# Patient Record
Sex: Male | Born: 1980 | Race: White | Marital: Married | Smoking: Never smoker
Health system: Southern US, Community
[De-identification: ages and names within clinical notes are randomized; demographics above are authoritative.]

## PROBLEM LIST (undated history)

## (undated) DIAGNOSIS — I10 Essential (primary) hypertension: Secondary | ICD-10-CM

## (undated) DIAGNOSIS — E119 Type 2 diabetes mellitus without complications: Secondary | ICD-10-CM

## (undated) HISTORY — PX: FRACTURE SURGERY: SHX138

---

## 2019-10-18 ENCOUNTER — Encounter: Payer: Self-pay | Admitting: Emergency Medicine

## 2019-10-18 ENCOUNTER — Emergency Department: Payer: Self-pay

## 2019-10-18 ENCOUNTER — Emergency Department
Admission: EM | Admit: 2019-10-18 | Discharge: 2019-10-18 | Disposition: A | Discharge status: Home / Self Care | Payer: Self-pay | Attending: Emergency Medicine | Admitting: Emergency Medicine

## 2019-10-18 ENCOUNTER — Other Ambulatory Visit: Payer: Self-pay

## 2019-10-18 DIAGNOSIS — E119 Type 2 diabetes mellitus without complications: Secondary | ICD-10-CM | POA: Insufficient documentation

## 2019-10-18 DIAGNOSIS — Z20822 Contact with and (suspected) exposure to covid-19: Secondary | ICD-10-CM | POA: Insufficient documentation

## 2019-10-18 DIAGNOSIS — I10 Essential (primary) hypertension: Secondary | ICD-10-CM | POA: Insufficient documentation

## 2019-10-18 DIAGNOSIS — R1110 Vomiting, unspecified: Secondary | ICD-10-CM | POA: Insufficient documentation

## 2019-10-18 HISTORY — DX: Type 2 diabetes mellitus without complications: E11.9

## 2019-10-18 HISTORY — DX: Essential (primary) hypertension: I10

## 2019-10-18 LAB — COMPREHENSIVE METABOLIC PANEL
ALT: 14 U/L (ref 0–44)
AST: 9 U/L — ABNORMAL LOW (ref 15–41)
Albumin: 4.4 g/dL (ref 3.5–5.0)
Alkaline Phosphatase: 72 U/L (ref 38–126)
Anion gap: 15 (ref 5–15)
BUN: 16 mg/dL (ref 6–20)
CO2: 22 mmol/L (ref 22–32)
Calcium: 9.3 mg/dL (ref 8.9–10.3)
Chloride: 93 mmol/L — ABNORMAL LOW (ref 98–111)
Creatinine, Ser: 0.89 mg/dL (ref 0.61–1.24)
GFR calc Af Amer: >60 mL/min (ref >60)
GFR calc non Af Amer: >60 mL/min (ref >60)
Glucose, Bld: 231 mg/dL — ABNORMAL HIGH (ref 70–99)
Potassium: 4.2 mmol/L (ref 3.5–5.1)
Sodium: 130 mmol/L — ABNORMAL LOW (ref 135–145)
Total Bilirubin: 1.4 mg/dL — ABNORMAL HIGH (ref 0.3–1.2)
Total Protein: 7.9 g/dL (ref 6.5–8.1)

## 2019-10-18 LAB — CBC
HCT: 47.8 % (ref 39.0–52.0)
Hemoglobin: 16 g/dL (ref 13.0–17.0)
MCH: 28.1 pg (ref 26.0–34.0)
MCHC: 33.5 g/dL (ref 30.0–36.0)
MCV: 83.9 fL (ref 80.0–100.0)
Platelets: 298 10*3/uL (ref 150–400)
RBC: 5.7 MIL/uL (ref 4.22–5.81)
RDW: 12.4 % (ref 11.5–15.5)
WBC: 9.3 10*3/uL (ref 4.0–10.5)
nRBC: 0 % (ref 0.0–0.2)

## 2019-10-18 LAB — URINALYSIS, COMPLETE (UACMP) WITH MICROSCOPIC
Bacteria, UA: NONE SEEN
Bilirubin Urine: NEGATIVE
Glucose, UA: 50 mg/dL — AB
Hgb urine dipstick: NEGATIVE
Ketones, ur: 80 mg/dL — AB
Leukocytes,Ua: NEGATIVE
Nitrite: NEGATIVE
Protein, ur: 30 mg/dL — AB
Specific Gravity, Urine: 1.029 (ref 1.005–1.030)
pH: 5 (ref 5.0–8.0)

## 2019-10-18 LAB — LIPASE, BLOOD: Lipase: 32 U/L (ref 11–51)

## 2019-10-18 LAB — POC SARS CORONAVIRUS 2 AG: SARS Coronavirus 2 Ag: NEGATIVE

## 2019-10-18 LAB — GLUCOSE, CAPILLARY: Glucose-Capillary: 241 mg/dL — ABNORMAL HIGH (ref 70–99)

## 2019-10-18 MED ORDER — INSULIN ASPART 100 UNIT/ML ~~LOC~~ SOLN
4.0000 [IU] | Freq: Once | SUBCUTANEOUS | Status: AC
  Administered 2019-10-18: 4 [IU] via INTRAVENOUS
  Filled 2019-10-18: qty 1

## 2019-10-18 MED ORDER — SODIUM CHLORIDE 0.9% FLUSH: 3.0000 mL | Freq: Once | INTRAVENOUS | Status: DC

## 2019-10-18 MED ORDER — PROMETHAZINE HCL 25 MG/ML IJ SOLN
25.0000 mg | Freq: Once | INTRAMUSCULAR | Status: AC
  Administered 2019-10-18: 25 mg via INTRAVENOUS
  Filled 2019-10-18: qty 1

## 2019-10-18 MED ORDER — SODIUM CHLORIDE 0.9 % IV BOLUS
1000.0000 mL | Freq: Once | INTRAVENOUS | Status: AC
  Administered 2019-10-18: 1000 mL via INTRAVENOUS

## 2019-10-18 MED ORDER — METOCLOPRAMIDE HCL 5 MG/ML IJ SOLN
10.0000 mg | Freq: Once | INTRAMUSCULAR | Status: AC
  Administered 2019-10-18: 10 mg via INTRAVENOUS
  Filled 2019-10-18: qty 2

## 2019-10-18 MED ORDER — PROMETHAZINE HCL 25 MG PO TABS: 25.0000 mg | ORAL_TABLET | Freq: Four times a day (QID) | ORAL | 0 refills | Status: DC | PRN

## 2019-10-18 MED ORDER — ONDANSETRON HCL 4 MG/2ML IJ SOLN
4.0000 mg | Freq: Once | INTRAMUSCULAR | Status: AC
  Administered 2019-10-18: 4 mg via INTRAVENOUS
  Filled 2019-10-18: qty 2

## 2019-10-18 MED ORDER — IOHEXOL 300 MG/ML  SOLN
100.0000 mL | Freq: Once | INTRAMUSCULAR | Status: AC | PRN
  Administered 2019-10-18: 100 mL via INTRAVENOUS
  Filled 2019-10-18: qty 100

## 2019-10-18 NOTE — ED Provider Notes (Signed)
[facility] Emergency Department Provider Note  ____________________________________________   First MD Initiated Contact with Patient 10/18/19 1134     (approximate)  I have reviewed the triage vital signs and the nursing notes.   HISTORY  Chief Complaint Emesis    HPI Christopher Livingston. is a 39 y.o. male presents emergency department thinking that he has food poisoning.  However he states that his right lower quadrant does hurt.  Is had more than 12 episodes of vomiting in the past 24 hours.  He is a insulin-dependent diabetic.  He is concerned that he is getting worse.  He states he is started to run a fever and has chills.  No diarrhea.  He denies chest pain, shortness of breath.  He denies Covid exposure    Past Medical History:  Diagnosis Date  . Diabetes mellitus without complication (HCC)   . Hypertension     There are no problems to display for this patient.   Past Surgical History:  Procedure Laterality Date  . FRACTURE SURGERY      Prior to Admission medications   Medication Sig Start Date End Date Taking? Authorizing Provider  promethazine (PHENERGAN) 25 MG tablet Take 1 tablet (25 mg total) by mouth every 6 (six) hours as needed for nausea or vomiting. 10/18/19   Faythe Ghee, PA-C    Allergies Levaquin [levofloxacin]  No family history on file.  Social History Social History   Tobacco Use  . Smoking status: Never Smoker  . Smokeless tobacco: Never Used  Substance Use Topics  . Alcohol use: Not Currently  . Drug use: Not Currently    Review of Systems  Constitutional: No fever/chills Eyes: No visual changes. ENT: No sore throat. Respiratory: Denies cough Cardiovascular: Denies chest pain Gastrointestinal: Positive for right lower quadrant abdominal pain, positive for vomiting Genitourinary: Negative for dysuria. Musculoskeletal: Negative for back pain. Skin: Negative for rash. Psychiatric: no mood changes,      ____________________________________________   PHYSICAL EXAM:  VITAL SIGNS: ED Triage Vitals  Enc Vitals Group     BP 10/18/19 0827 (!) 155/80     Pulse Rate 10/18/19 0827 81     Resp 10/18/19 0827 16     Temp 10/18/19 0827 98.4 F (36.9 C)     Temp Source 10/18/19 0827 Oral     SpO2 10/18/19 0827 100 %     Weight 10/18/19 0828 161 lb (73 kg)     Height 10/18/19 0828 5\' 7"  (1.702 m)     Head Circumference --      Peak Flow --      Pain Score 10/18/19 0828 0     Pain Loc --      Pain Edu? --      Excl. in GC? --     Constitutional: Alert and oriented. Well appearing and in no acute distress. Eyes: Conjunctivae are normal.  Head: Atraumatic. Nose: No congestion/rhinnorhea. Mouth/Throat: Mucous membranes are moist.   Neck:  supple no lymphadenopathy noted Cardiovascular: Normal rate, regular rhythm. Heart sounds are normal Respiratory: Normal respiratory effort.  No retractions, lungs c t a  Abd: soft tender in the right lower quadrant, bs normal all 4 quad GU: deferred Musculoskeletal: FROM all extremities, warm and well perfused Neurologic:  Normal speech and language.  Skin:  Skin is warm, dry and intact. No rash noted. Psychiatric: Mood and affect are normal. Speech and behavior are normal.  ____________________________________________   LABS (all labs ordered are  listed, but only abnormal results are displayed)  Labs Reviewed  COMPREHENSIVE METABOLIC PANEL - Abnormal; Notable for the following components:      Result Value   Sodium 130 (*)    Chloride 93 (*)    Glucose, Bld 231 (*)    AST 9 (*)    Total Bilirubin 1.4 (*)    All other components within normal limits  URINALYSIS, COMPLETE (UACMP) WITH MICROSCOPIC - Abnormal; Notable for the following components:   Color, Urine YELLOW (*)    APPearance CLEAR (*)    Glucose, UA 50 (*)    Ketones, ur 80 (*)    Protein, ur 30 (*)    All other components within normal limits  GLUCOSE, CAPILLARY -  Abnormal; Notable for the following components:   Glucose-Capillary 241 (*)    All other components within normal limits  LIPASE, BLOOD  CBC  CBG MONITORING, ED  POC SARS CORONAVIRUS 2 AG -  ED  POC SARS CORONAVIRUS 2 AG   ____________________________________________   ____________________________________________  RADIOLOGY  CT abdomen/pelvis with IV contrast is negative  ____________________________________________   PROCEDURES  Procedure(s) performed: Saline lock, normal saline 2 L IV, Zofran, Phenergan, and Reglan given   Procedures    ____________________________________________   INITIAL IMPRESSION / ASSESSMENT AND PLAN / ED COURSE  Pertinent labs & imaging results that were available during my care of the patient were reviewed by me and considered in my medical decision making (see chart for details).   Patient is 39 year old insulin-dependent diabetic presents emergency department complaining of right lower quadrant pain along with 12 episodes of vomiting last 24 hours.  See HPI  Physical exam patient's vitals are normal.  he appears to be very stable.  Abdomen is soft but tender in the right lower quadrant.  DDx: Acute appendicitis, food poisoning, bowel obstruction, viral illness  CBC is normal, comprehensive metabolic panel has glucose of 231, sodium of 130, bilirubin is slightly increased at 1.4, lipase is normal, urinalysis shows 80 ketones  Explained the findings to the patient.  He will get fluids.  Due to the right lower quadrant pain I will do a CT abdomen/pelvis to rule out appendicitis.   CT abdomen/pelvis is negative.  Patient continues to have some nausea does not feel that he can hold solid foods down.  He was given Phenergan IV along with a second bolus of normal saline.  After the Phenergan and a second bolus he still feels that he cannot retain solid foods.  He was given Reglan which she states he kept after cracker down.  He has been able to  keep liquids down.  And I offered to put him in the hospital due to being unable to hold solid foods down, he refuses.  States that he will have insurance and cannot afford to stay.  I feel give me Phenergan I will return if I am getting worse. We did discuss strict instructions for return.  Have cautioned him that he could end up in DKA.  Patient still wants to be discharged.  Christopher Livingston. was evaluated in Emergency Department on 10/18/2019 for the symptoms described in the history of present illness. He was evaluated in the context of the global COVID-19 pandemic, which necessitated consideration that the patient might be at risk for infection with the SARS-CoV-2 virus that causes COVID-19. Institutional protocols and algorithms that pertain to the evaluation of patients at risk for COVID-19 are in a state of rapid change based on  information released by regulatory bodies including the CDC and federal and state organizations. These policies and algorithms were followed during the patient's care in the ED.   As part of my medical decision making, I reviewed the following data within the electronic MEDICAL RECORD NUMBER Nursing notes reviewed and incorporated, Labs reviewed see above, Old chart reviewed, Radiograph reviewed see above, Notes from prior ED visits and [place] Controlled Substance Database  ____________________________________________   FINAL CLINICAL IMPRESSION(S) / ED DIAGNOSES  Final diagnoses:  Vomiting in adult      NEW MEDICATIONS STARTED DURING THIS VISIT:  New Prescriptions   PROMETHAZINE (PHENERGAN) 25 MG TABLET    Take 1 tablet (25 mg total) by mouth every 6 (six) hours as needed for nausea or vomiting.     Note:  This document was prepared using Dragon voice recognition software and may include unintentional dictation errors.    Faythe Ghee, PA-C 10/18/19 1509    Phineas Semen, MD 10/18/19 [phone]

## 2019-10-18 NOTE — ED Notes (Signed)
Blood Glucose 241  

## 2019-10-18 NOTE — ED Notes (Signed)
Pt reports eating some "bad chicken" 2 days ago and not being able to keep any food down. Pt states he has had 12 episodes of emesis in the past 24 hours. Pt denies any diarrhea. Pt is type 1 diabetic.

## 2019-10-18 NOTE — Discharge Instructions (Addendum)
Return to the emergency department if you are unable to hold solid foods down.  Take medication as prescribed.

## 2019-10-18 NOTE — ED Triage Notes (Addendum)
Pt to ED via POV c/o vomiting since Friday. Pt states that he is not able to keep anything down. Last vomited this morning. Denies pain at this time, state that he did have pain when this first started on Friday. Pt denies known fever. PT has hx/o diabetes. Pt is in NAD at this time.

## 2020-08-04 ENCOUNTER — Ambulatory Visit (INDEPENDENT_AMBULATORY_CARE_PROVIDER_SITE_OTHER): Payer: No Typology Code available for payment source

## 2020-08-04 ENCOUNTER — Other Ambulatory Visit: Payer: Self-pay

## 2020-08-04 ENCOUNTER — Ambulatory Visit
Admission: RE | Admit: 2020-08-04 | Discharge: 2020-08-04 | Disposition: A | Discharge status: Home / Self Care | Payer: No Typology Code available for payment source | Source: Ambulatory Visit | Attending: Family Medicine | Admitting: Family Medicine

## 2020-08-04 VITALS — BP 135/81 | HR 105 | Temp 98.4°F | Resp 18 | Ht 67.0 in | Wt 173.0 lb

## 2020-08-04 DIAGNOSIS — R059 Cough, unspecified: Secondary | ICD-10-CM | POA: Diagnosis not present

## 2020-08-04 DIAGNOSIS — R0982 Postnasal drip: Secondary | ICD-10-CM

## 2020-08-04 DIAGNOSIS — J209 Acute bronchitis, unspecified: Secondary | ICD-10-CM | POA: Diagnosis not present

## 2020-08-04 MED ORDER — AZITHROMYCIN 250 MG PO TABS: ORAL_TABLET | ORAL | 0 refills | Status: DC

## 2020-08-04 MED ORDER — IPRATROPIUM BROMIDE 0.06 % NA SOLN: 2.0000 | Freq: Four times a day (QID) | NASAL | 0 refills | Status: DC | PRN

## 2020-08-04 MED ORDER — BENZONATATE 200 MG PO CAPS: 200.0000 mg | ORAL_CAPSULE | Freq: Three times a day (TID) | ORAL | 0 refills | Status: DC | PRN

## 2020-08-04 NOTE — ED Provider Notes (Signed)
[facility]    CSN: [id number] Arrival date & time: 08/04/20  0846  History   Chief Complaint Chief Complaint  Patient presents with  . Appointment  . Cough  . post nasal drip  . sneezing   HPI  39 year old male presents with cough, rhinorrhea, postnasal drip.  3-week history of symptoms.  He is most troubled by the cough.  He was sent by his employer as he was coughing quite a bit at work.  No fever.  He has had sick contacts.  He states that he has had a recent cold.  Has been using Sudafed, Zyrtec, and guaifenesin without resolution.  He has been vaccine against COVID-19.  No fever.  No other reported symptoms.  No other complaints.   Past Medical History:  Diagnosis Date  . Diabetes mellitus without complication (HCC)   . Hypertension    Past Surgical History:  Procedure Laterality Date  . FRACTURE SURGERY     Home Medications    Prior to Admission medications   Medication Sig Start Date End Date Taking? Authorizing Provider  insulin aspart (NOVOLOG) 100 UNIT/ML injection Inject into the skin 3 (three) times daily before meals. Sliding scale   Yes [provider]  insulin glargine (LANTUS) 100 UNIT/ML injection Inject 40 Units into the skin at bedtime.   Yes [provider]  azithromycin (ZITHROMAX) 250 MG tablet 2 tablets on day 1, then 1 tablet daily on days 2-5. 08/04/20   Tommie Sams, DO  benzonatate (TESSALON) 200 MG capsule Take 1 capsule (200 mg total) by mouth 3 (three) times daily as needed for cough. 08/04/20   Everlene Other G, DO  ipratropium (ATROVENT) 0.06 % nasal spray Place 2 sprays into both nostrils 4 (four) times daily as needed for rhinitis. 08/04/20   Tommie Sams, DO  promethazine (PHENERGAN) 25 MG tablet Take 1 tablet (25 mg total) by mouth every 6 (six) hours as needed for nausea or vomiting. 10/18/19 08/04/20  Faythe Ghee, PA-C    Family History Family History  Problem Relation Age of Onset  . Cancer Mother   .  Diabetes Mother   . Diabetes Father     Social History Social History   Tobacco Use  . Smoking status: Never Smoker  . Smokeless tobacco: Never Used  Vaping Use  . Vaping Use: Never used  Substance Use Topics  . Alcohol use: Not Currently  . Drug use: Not Currently     Allergies   Levaquin [levofloxacin]   Review of Systems Review of Systems  Constitutional: Negative for fever.  HENT: Positive for postnasal drip and rhinorrhea.   Respiratory: Positive for cough.    Physical Exam Triage Vital Signs ED Triage Vitals  Enc Vitals Group     BP 08/04/20 0911 135/81     Pulse Rate 08/04/20 0911 (!) 105     Resp 08/04/20 0911 18     Temp 08/04/20 0911 98.4 F (36.9 C)     Temp Source 08/04/20 0911 Oral     SpO2 08/04/20 0911 100 %     Weight 08/04/20 0912 173 lb (78.5 kg)     Height 08/04/20 0912 5\' 7"  (1.702 m)     Head Circumference --      Peak Flow --      Pain Score 08/04/20 0911 0     Pain Loc --      Pain Edu? --      Excl. in GC? --  Updated Vital Signs BP 135/81 (BP Location: Left Arm)   Pulse (!) 105   Temp 98.4 F (36.9 C) (Oral)   Resp 18   Ht 5\' 7"  (1.702 m)   Wt 78.5 kg   SpO2 100%   BMI 27.10 kg/m   Visual Acuity Right Eye Distance:   Left Eye Distance:   Bilateral Distance:    Right Eye Near:   Left Eye Near:    Bilateral Near:     Physical Exam Vitals and nursing note reviewed.  Constitutional:      General: He is not in acute distress.    Appearance: Normal appearance. He is not ill-appearing.  HENT:     Head: Normocephalic and atraumatic.     Nose: Rhinorrhea present.  Eyes:     General:        Right eye: No discharge.        Left eye: No discharge.     Conjunctiva/sclera: Conjunctivae normal.  Cardiovascular:     Rate and Rhythm: Regular rhythm. Tachycardia present.  Pulmonary:     Effort: Pulmonary effort is normal.     Breath sounds: Normal breath sounds.  Neurological:     Mental Status: He is alert.   Psychiatric:        Mood and Affect: Mood normal.        Behavior: Behavior normal.    UC Treatments / Results  Labs (all labs ordered are listed, but only abnormal results are displayed) Labs Reviewed - No data to display  EKG   Radiology DG Chest 2 View  Result Date: 08/04/2020 CLINICAL DATA:  Cough EXAM: CHEST - 2 VIEW COMPARISON:  None. FINDINGS: The heart size and mediastinal contours are within normal limits. Both lungs are clear. No pleural effusion. The visualized skeletal structures are unremarkable. IMPRESSION: No acute process in the chest. Electronically Signed   By: 13/12/2019 M.D.   On: 08/04/2020 09:27    Procedures Procedures (including critical care time)  Medications Ordered in UC Medications - No data to display  Initial Impression / Assessment and Plan / UC Course  I have reviewed the triage vital signs and the nursing notes.  Pertinent labs & imaging results that were available during my care of the patient were reviewed by me and considered in my medical decision making (see chart for details).    39 year old male presents with acute bronchitis.  Also having postnasal drip.  Given duration of symptoms, treating with Tessalon Perles, azithromycin. Atrovent nasal spray as directed.  Final Clinical Impressions(s) / UC Diagnoses   Final diagnoses:  Acute bronchitis, unspecified organism  Post-nasal drip     Discharge Instructions     Medications as prescribed.  Lots of fluids.  Okay to return to work.  Take care  Dr. 30    ED Prescriptions    Medication Sig Dispense Auth. Provider   benzonatate (TESSALON) 200 MG capsule Take 1 capsule (200 mg total) by mouth 3 (three) times daily as needed for cough. 30 capsule Sherrye Puga G, DO   azithromycin (ZITHROMAX) 250 MG tablet 2 tablets on day 1, then 1 tablet daily on days 2-5. 6 tablet Mialynn Shelvin G, DO   ipratropium (ATROVENT) 0.06 % nasal spray Place 2 sprays into both nostrils 4 (four)  times daily as needed for rhinitis. 15 mL 12-[birth year]-1983, DO     PDMP not reviewed this encounter.   Tommie Sams, DO 08/04/20 1019

## 2020-08-04 NOTE — Discharge Instructions (Signed)
Medications as prescribed.  Lots of fluids.  Okay to return to work.  Take care  Dr. Adriana Simas

## 2020-08-04 NOTE — ED Triage Notes (Signed)
Patient in today c/o 3 week history of cough, sneezing and post nasal drip. Patient denies any fevers. Patient has taken OTC Sudafed, Zyrtec and Guaifenesin without relief. Patient has completed the covid vaccines.

## 2021-03-17 DIAGNOSIS — G8929 Other chronic pain: Secondary | ICD-10-CM | POA: Insufficient documentation

## 2021-03-17 DIAGNOSIS — N529 Male erectile dysfunction, unspecified: Secondary | ICD-10-CM | POA: Insufficient documentation

## 2021-03-17 DIAGNOSIS — L409 Psoriasis, unspecified: Secondary | ICD-10-CM | POA: Insufficient documentation

## 2021-03-17 DIAGNOSIS — M25572 Pain in left ankle and joints of left foot: Secondary | ICD-10-CM | POA: Insufficient documentation

## 2021-03-25 DIAGNOSIS — E1169 Type 2 diabetes mellitus with other specified complication: Secondary | ICD-10-CM | POA: Insufficient documentation

## 2021-03-25 DIAGNOSIS — E782 Mixed hyperlipidemia: Secondary | ICD-10-CM | POA: Insufficient documentation

## 2021-03-25 DIAGNOSIS — E119 Type 2 diabetes mellitus without complications: Secondary | ICD-10-CM | POA: Insufficient documentation

## 2021-03-29 ENCOUNTER — Encounter: Payer: Self-pay | Admitting: Urology

## 2021-03-29 ENCOUNTER — Ambulatory Visit (INDEPENDENT_AMBULATORY_CARE_PROVIDER_SITE_OTHER): Payer: No Typology Code available for payment source | Admitting: Urology

## 2021-03-29 ENCOUNTER — Other Ambulatory Visit: Payer: Self-pay

## 2021-03-29 VITALS — BP 145/83 | HR 94 | Ht 67.0 in | Wt 184.0 lb

## 2021-03-29 DIAGNOSIS — Z3009 Encounter for other general counseling and advice on contraception: Secondary | ICD-10-CM | POA: Diagnosis not present

## 2021-03-29 DIAGNOSIS — N5201 Erectile dysfunction due to arterial insufficiency: Secondary | ICD-10-CM | POA: Diagnosis not present

## 2021-03-29 MED ORDER — TADALAFIL 5 MG PO TABS: 5.0000 mg | ORAL_TABLET | ORAL | 6 refills | Status: DC | PRN

## 2021-03-29 MED ORDER — DIAZEPAM 5 MG PO TABS: 5.0000 mg | ORAL_TABLET | Freq: Once | ORAL | 0 refills | Status: DC | PRN

## 2021-03-29 NOTE — Patient Instructions (Signed)
Pre-Vasectomy Instructions  STOP all aspirin or blood thinners (Aspirin, Plavix, Coumadin, Warfarin, Motrin, Ibuprofen, Advil, Aleve, Naproxen, Naprosyn) for 7 days prior to the procedure.  If you have any questions about stopping these medications please contact your primary care physician or cardiologist.  Shave all hair from the upper scrotum on the day of the procedure.  This means just under the penis onto the scrotal sac.  The area shaved should measure about 2-3 inches around.  You may lather the scrotum with soap and water, and shave with a safety razor.  After shaving the area, thoroughly wash the penis and the scrotum, then shower or bathe to remove all the loose hairs.  If needed, wash the area again just before coming in for your circumcision.  It is recommended to have a light meal an hour or so prior to the procedure.  Bring a scrotal support (jock strap or suspensory, or tight jockey shorts or underwear).  Wear comfortable pants or shorts.  While the actual procedure usually takes about 45 minutes, you should be prepared to stay in the office for approximately one hour.  Bring someone with you to drive you home.  If you have any questions or concerns, please feel free to call the office at [phone].  Vasectomy Vasectomy is a procedure in which the vas deferens is cut and then tied or burned (cauterized). The vas deferens is a tube that carries sperm from the testicle to the part of the body that drains urine from the bladder (urethra). This procedure blocks sperm from going through the vas deferens and penis during ejaculation. This ensures that sperm does not go into the vagina during sex. Vasectomy does not affect sexual desire or performance and does notprevent sexually transmitted infections. Vasectomy is considered a permanent and very effective form of birth control (contraception). The decision to have a vasectomy should not be made during a stressful time, such as after  the loss of a pregnancy or a divorce. You and your partner should decide on whether to have a vasectomy when you are sure that you do not wantchildren in the future. Tell a health care provider about: Any allergies you have. All medicines you are taking, including vitamins, herbs, eye drops, creams, and over-the-counter medicines. Any problems you or family members have had with anesthetic medicines. Any blood disorders you have. Any surgeries you have had. Any medical conditions you have. What are the risks? Generally, this is a safe procedure. However, problems may occur, including: Infection. Bleeding and swelling of the scrotum. The scrotum is the sac that contains the testicles, blood vessels, and structures that help deliver sperm and semen. Allergic reactions to medicines. Failure of the procedure to prevent pregnancy. There is a very small chance that the tied or cauterized ends of the vas deferens may reconnect (recanalization). If this happens, you could still make a woman pregnant. Pain in the scrotum that continues after you heal from the procedure. What happens before the procedure? Medicines Ask your health care provider about: Changing or stopping your regular medicines. This is especially important if you are taking diabetes medicines or blood thinners. Taking medicines such as aspirin and ibuprofen. These medicines can thin your blood. Do not take these medicines unless your health care provider tells you to take them. Taking over-the-counter medicines, vitamins, herbs, and supplements. You may be told to take a medicine to help you relax (sedative) a few hours before the procedure. General instructions Do not use any   products that contain nicotine or tobacco for at least 4 weeks before the procedure. These products include cigarettes, e-cigarettes, and chewing tobacco. If you need help quitting, ask your health care provider. Plan to have a responsible adult take you home  from the hospital or clinic. If you will be going home right after the procedure, plan to have a responsible adult care for you for the time you are told. This is important. Ask your health care provider: How your surgery site will be marked. What steps will be taken to help prevent infection. These steps may include: Removing hair at the surgery site. Washing skin with a germ-killing soap. Taking antibiotic medicine. What happens during the procedure?  You will be given one or more of the following: A sedative, unless you were told to take this a few hours before the procedure. A medicine to numb the area (local anesthetic). Your health care provider will feel, or palpate, for your vas deferens. To reach the vas deferens, one of two methods may be used: A very small incision may be made in your scrotum. A punctured opening may be made in your scrotum, without an incision. Your vas deferens will be pulled out of your scrotum and cut. Then, the vas deferens will be closed in one of two ways: Tied at the ends. Cauterized at the ends to seal them off. The vas deferens will be put back into your scrotum. The incision or puncture opening will be closed with absorbable stitches (sutures). The sutures will eventually dissolve and will not need to be removed after the procedure. The procedure will be repeated on the other side of your scrotum. The procedure may vary among health care providers and hospitals. What happens after the procedure? You will be monitored to make sure that you do not have problems. You will be asked not to ejaculate for at least 1 week after the procedure, or for as long as you are told. You will need to use a different form of contraception for 2-4 months after the procedure, until you have test results confirming that there are no sperm in your semen. You may be given scrotal support to wear, such as a jockstrap or underwear with a supportive pouch. If you were given a  sedative during the procedure, it can affect you for several hours. Do not drive or operate machinery until your health care provider says that it is safe. Summary Vasectomy blocks sperm from being released during ejaculation. This procedure is considered a permanent and very effective form of birth control. Your scrotum will be numbed with medicine (local anesthetic) for the procedure. After the procedure, you will be asked not to ejaculate for at least 1 week, or for as long as you are told. You will also need to use a different form of contraception until your test results confirm that there are no sperm in your semen. This information is not intended to replace advice given to you by your health care provider. Make sure you discuss any questions you have with your healthcare provider. Document Revised: 02/05/2020 Document Reviewed: 02/05/2020 Elsevier Patient Education  2022 [employer]  

## 2021-03-29 NOTE — Progress Notes (Signed)
   03/29/21 9:02 AM   Christopher Livingston. 1981-07-18 [id number]  CC: Discuss vasectomy, ED  HPI: 40 year old male with diabetes(hemoglobin A1c 8.7) who presents to discuss vasectomy, as well as erectile dysfunction.  He has had some trouble with erections over the last few months which he thinks may be related to stress.  He was tried on sildenafil 20 mg as needed by PCP with no significant improvement in his erections.  They have 2 children ages 76 and 59 and are not interested in further pregnancies.   PMH: Past Medical History:  Diagnosis Date   Diabetes mellitus without complication (HCC)    Hypertension     Surgical History: Past Surgical History:  Procedure Laterality Date   FRACTURE SURGERY      Family History: Family History  Problem Relation Age of Onset   Cancer Mother    Diabetes Mother    Diabetes Father     Social History:  reports that he has never smoked. He has never used smokeless tobacco. He reports previous alcohol use. He reports previous drug use.  Physical Exam: BP (!) 145/83 (BP Location: Left Arm, Patient Position: Sitting, Cuff Size: Normal)   Pulse 94   Ht 5\' 7"  (1.702 m)   Wt 184 lb (83.5 kg)   BMI 28.82 kg/m    Constitutional:  Alert and oriented, No acute distress. Cardiovascular: No clubbing, cyanosis, or edema. Respiratory: Normal respiratory effort, no increased work of breathing. GI: Abdomen is soft, nontender, nondistended, no abdominal masses GU: Phallus with patent meatus, no lesions, testicles 15 cc and descended bilaterally without masses, vas deferens challenging but palpable bilaterally  Assessment & Plan:   40 year old male with diabetes interested in vasectomy for permanent sterilization, as well as with erectile dysfunction.  We discussed the impact of poorly controlled diabetes as well as stress on erections, and the difference between the PDE 5 inhibitors.  We discussed the risks and benefits of vasectomy at length.   Vasectomy is intended to be a permanent form of contraception, and does not produce immediate sterility.  Following vasectomy another form of contraception is required until vas occlusion is confirmed by a post-vasectomy semen analysis obtained 2-3 months after the procedure.  Even after vas occlusion is confirmed, vasectomy is not 100% reliable in preventing pregnancy, and the failure rate is approximately 10/1998.  Repeat vasectomy is required in less than 1% of patients.  He should refrain from ejaculation for 1 week after vasectomy.  Options for fertility after vasectomy include vasectomy reversal, and sperm retrieval with in vitro fertilization or ICSI.  These options are not always successful and may be expensive.  Finally, there are other permanent and non-permanent alternatives to vasectomy available. There is no risk of erectile dysfunction, and the volume of semen will be similar to prior, as the majority of the ejaculate is from the prostate and seminal vesicles.   The procedure takes ~20 minutes.  We recommend patients take 5-10 mg of Valium 30 minutes prior, and he will need a driver post-procedure.  Local anesthetic is injected into the scrotal skin and a small segment of the vas deferens is removed, and the ends occluded. The complication rate is approximately 1-2%, and includes bleeding, infection, and development of chronic scrotal pain.  PLAN: Schedule vasectomy Valium sent to pharmacy Trial of Cialis 5 mg as needed for ED   11/1998, MD 03/29/2021  [facility] [address] [place], [place] [phone]

## 2021-04-28 ENCOUNTER — Encounter: Payer: No Typology Code available for payment source | Admitting: Urology

## 2021-07-13 IMAGING — CT CT ABD-PELV W/ CM
2 of 4 series · 16 of 46 positions shown, 18 images · IV contrast (omnipaque)
Comparison: None.

CLINICAL DATA: Right lower quadrant pain, nausea, vomiting

EXAM:
CT ABDOMEN AND PELVIS WITH CONTRAST
TECHNIQUE: Multidetector CT imaging of the abdomen and pelvis was performed
using the standard protocol following bolus administration of
intravenous contrast.
CONTRAST:  100mL OMNIPAQUE IOHEXOL 300 MG/ML  SOLN

[Series 2: routine abd/pel with · axial · 0.77mm/px · z∈[-1009,-579]mm · 13 of 96 slices shown, 15 images]
[im 5/96  soft-tissue]
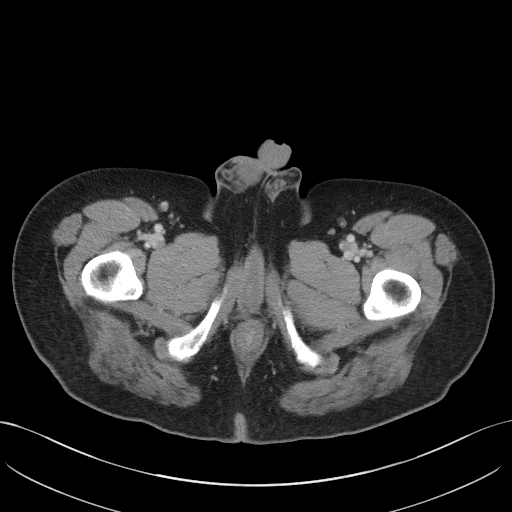
[im 5/96  bone]
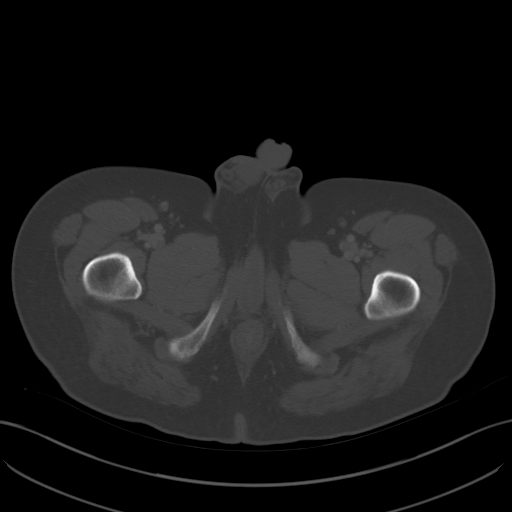
[im 13/96  soft-tissue]
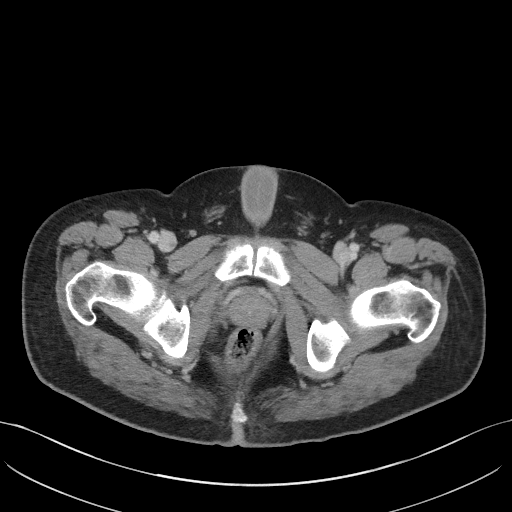
[im 21/96  soft-tissue]
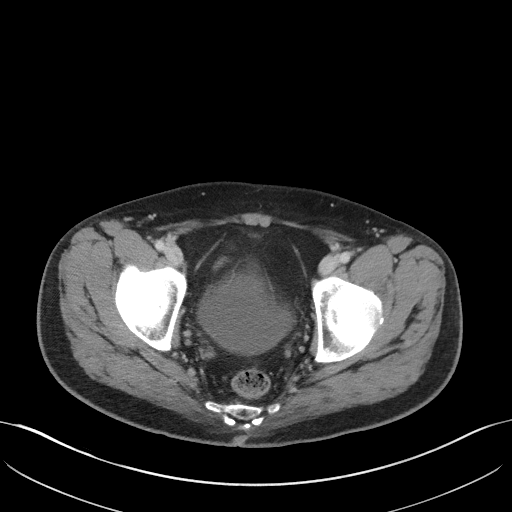
[im 25/96  soft-tissue]
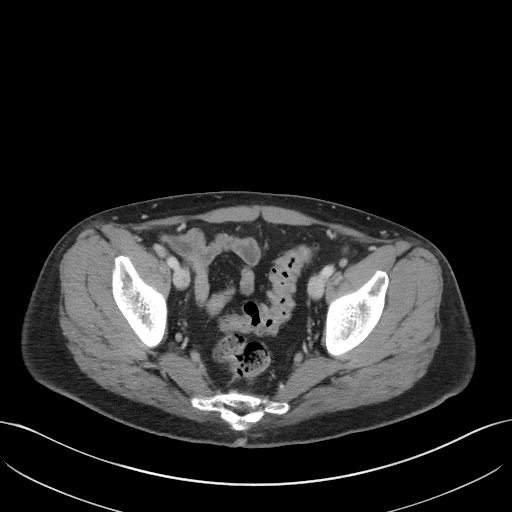
[im 34/96  soft-tissue]
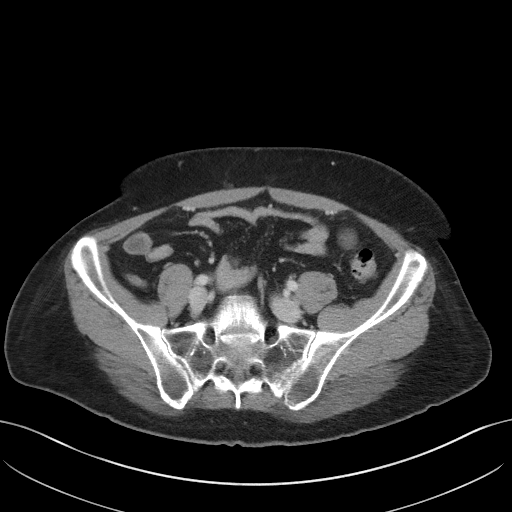
[im 42/96  soft-tissue]
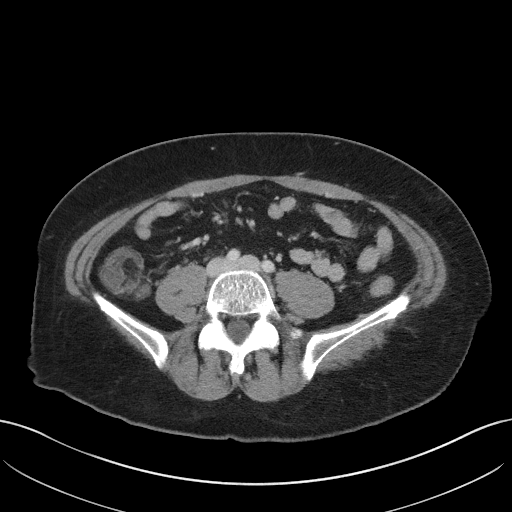
[im 50/96  soft-tissue]
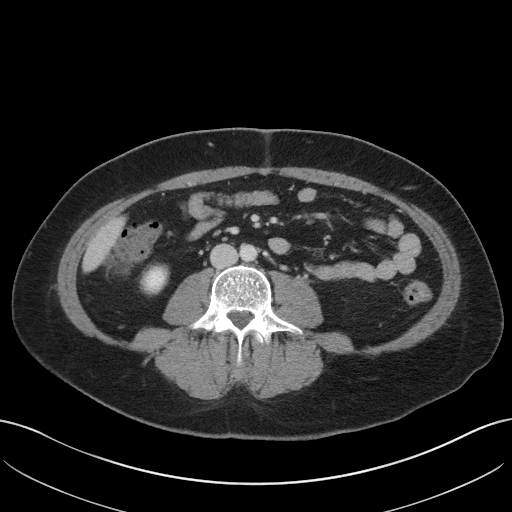
[im 54/96  soft-tissue]
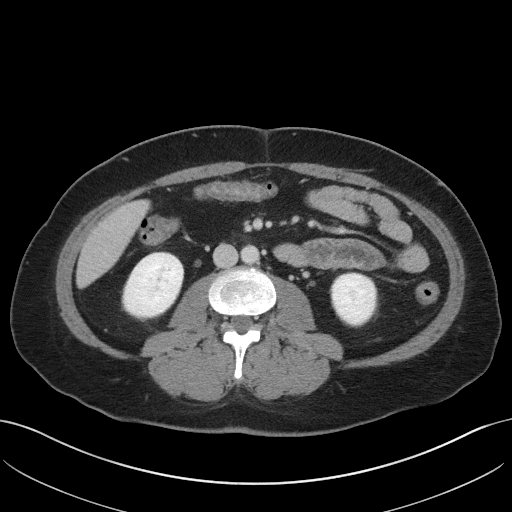
[im 62/96  soft-tissue]
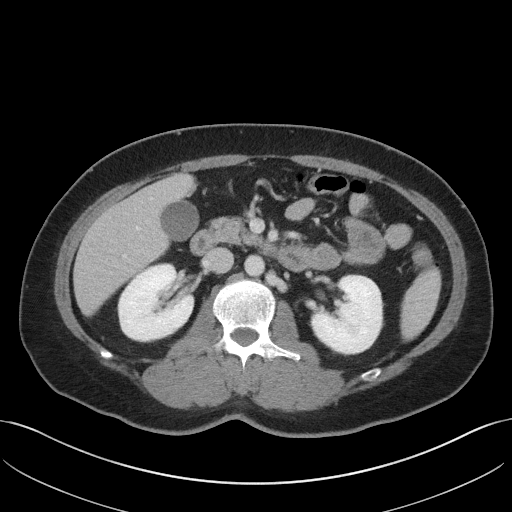
[im 62/96  bone]
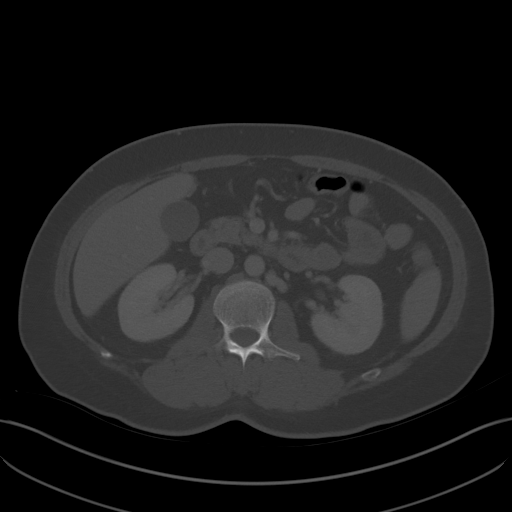
[im 71/96  soft-tissue]
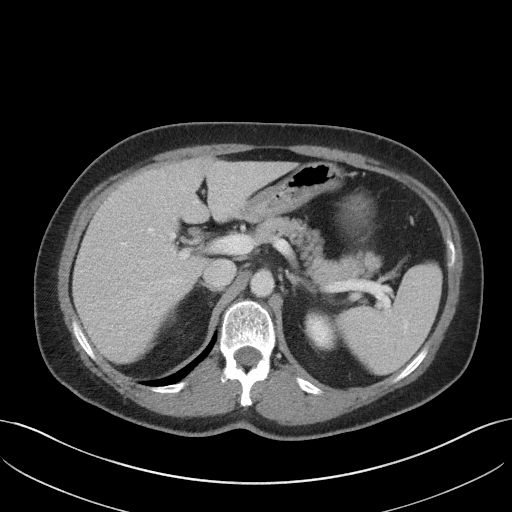
[im 75/96  soft-tissue]
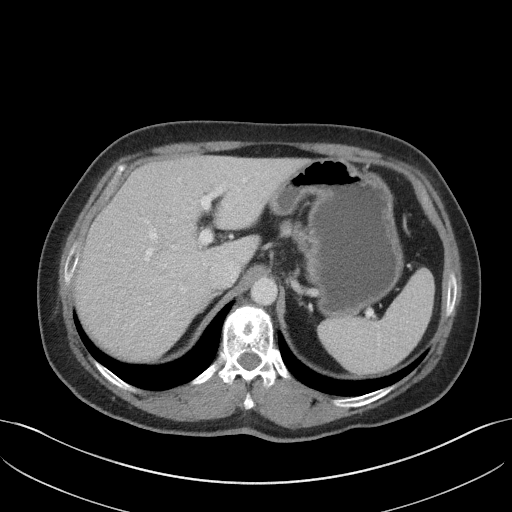
[im 83/96  soft-tissue]
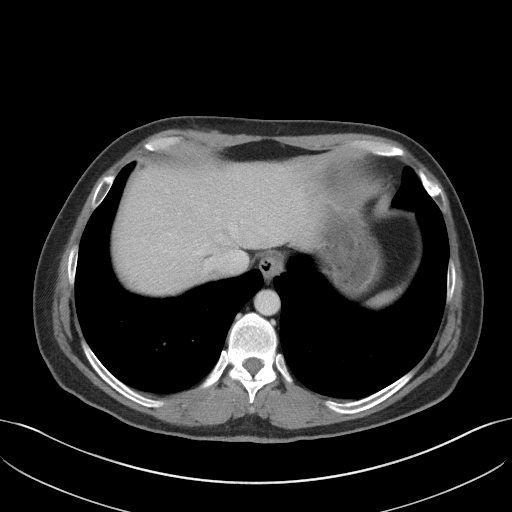
[im 91/96  soft-tissue]
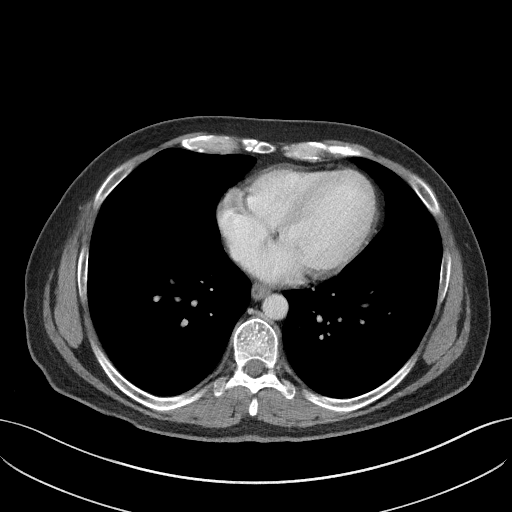

[Series 5: coronal st · coronal · 0.76mm/px · 3 of 87 slices shown]
[im 29/87  soft-tissue]
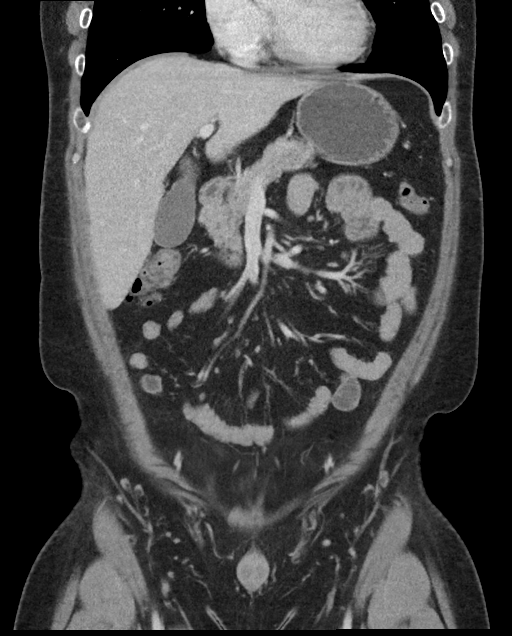
[im 39/87  soft-tissue]
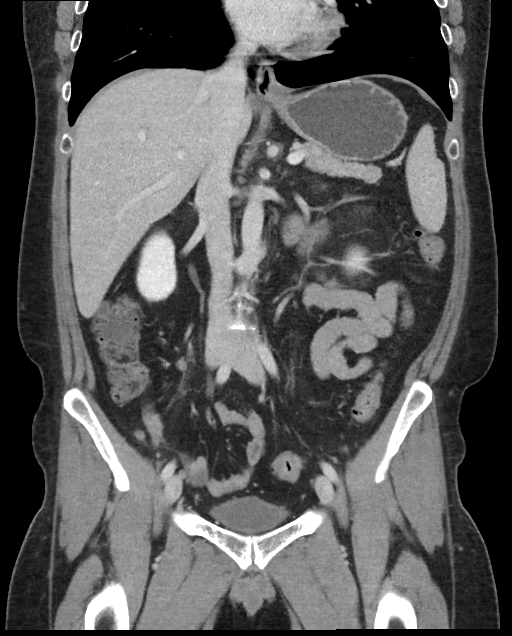
[im 48/87  soft-tissue]
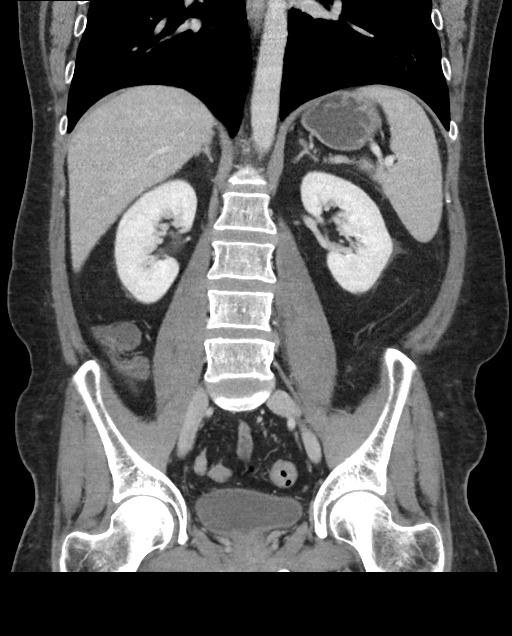

[16 of 46 positions shown; findings below may reference images not displayed]

FINDINGS: Lower chest: No acute abnormality.

Hepatobiliary: No focal liver abnormality is seen. No gallstones,
gallbladder wall thickening, or biliary dilatation.

Pancreas: Unremarkable. No pancreatic ductal dilatation or
surrounding inflammatory changes.

Spleen: Normal in size without focal abnormality.

Adrenals/Urinary Tract: Unremarkable adrenal glands. Kidneys enhance
symmetrically without focal lesion, calculus, or hydronephrosis.
Ureters are nondilated without ureteral calculus. There is
borderline thickening of the urinary bladder wall, which may be
accentuated by underdistention. Bladder appears otherwise
unremarkable.

Stomach/Bowel: Stomach is within normal limits. Appendix appears
normal. No evidence of bowel wall thickening, distention, or
inflammatory changes.

Vascular/Lymphatic: Circumaortic left renal vein, an anatomic
variant. No acute vascular abnormality. No abdominopelvic
lymphadenopathy.

Reproductive: Prostate is unremarkable.

Other: No abdominal wall hernia or abnormality. No abdominopelvic
ascites.

Musculoskeletal: Chronic appearing inferior endplate irregularity of
the T12 vertebrae with likely large degenerative Schmorl's node.
Osseous structures appear otherwise intact and unremarkable. No
acute osseous findings.
IMPRESSION: 1. No acute abdominopelvic findings.  Normal appendix.
2. Borderline thickening of the urinary bladder wall may be
accentuated by underdistention. Correlate with urinalysis to exclude
the possibility of cystitis.
3. Chronic-appearing inferior endplate depression of the T12
vertebrae with likely large Schmorl's node.

## 2021-10-13 ENCOUNTER — Other Ambulatory Visit: Payer: Self-pay

## 2021-10-13 ENCOUNTER — Emergency Department
Admission: EM | Admit: 2021-10-13 | Discharge: 2021-10-13 | Disposition: A | Discharge status: Home / Self Care | Payer: No Typology Code available for payment source | Attending: Emergency Medicine | Admitting: Emergency Medicine

## 2021-10-13 ENCOUNTER — Ambulatory Visit (INDEPENDENT_AMBULATORY_CARE_PROVIDER_SITE_OTHER)
Admission: EM | Admit: 2021-10-13 | Discharge: 2021-10-13 | Disposition: A | Discharge status: Home / Self Care | Payer: No Typology Code available for payment source | Source: Home / Self Care

## 2021-10-13 DIAGNOSIS — M791 Myalgia, unspecified site: Secondary | ICD-10-CM | POA: Diagnosis not present

## 2021-10-13 DIAGNOSIS — E860 Dehydration: Secondary | ICD-10-CM | POA: Insufficient documentation

## 2021-10-13 DIAGNOSIS — E1169 Type 2 diabetes mellitus with other specified complication: Secondary | ICD-10-CM

## 2021-10-13 DIAGNOSIS — E119 Type 2 diabetes mellitus without complications: Secondary | ICD-10-CM | POA: Insufficient documentation

## 2021-10-13 DIAGNOSIS — Z794 Long term (current) use of insulin: Secondary | ICD-10-CM | POA: Insufficient documentation

## 2021-10-13 DIAGNOSIS — R112 Nausea with vomiting, unspecified: Secondary | ICD-10-CM

## 2021-10-13 DIAGNOSIS — I10 Essential (primary) hypertension: Secondary | ICD-10-CM | POA: Insufficient documentation

## 2021-10-13 DIAGNOSIS — E1165 Type 2 diabetes mellitus with hyperglycemia: Secondary | ICD-10-CM | POA: Insufficient documentation

## 2021-10-13 DIAGNOSIS — R824 Acetonuria: Secondary | ICD-10-CM | POA: Insufficient documentation

## 2021-10-13 DIAGNOSIS — Z20822 Contact with and (suspected) exposure to covid-19: Secondary | ICD-10-CM | POA: Insufficient documentation

## 2021-10-13 LAB — URINALYSIS, COMPLETE (UACMP) WITH MICROSCOPIC
Bilirubin Urine: NEGATIVE
Bilirubin Urine: NEGATIVE
Glucose, UA: 100 mg/dL — AB
Glucose, UA: 100 mg/dL — AB
Ketones, ur: 40 mg/dL — AB
Ketones, ur: 80 mg/dL — AB
Leukocytes,Ua: NEGATIVE
Leukocytes,Ua: NEGATIVE
Nitrite: NEGATIVE
Nitrite: NEGATIVE
Protein, ur: NEGATIVE mg/dL
Protein, ur: NEGATIVE mg/dL
Specific Gravity, Urine: 1.02 (ref 1.005–1.030)
Specific Gravity, Urine: 1.025 (ref 1.005–1.030)
pH: 5.5 (ref 5.0–8.0)
pH: 6.5 (ref 5.0–8.0)

## 2021-10-13 LAB — MAGNESIUM: Magnesium: 1.7 mg/dL (ref 1.7–2.4)

## 2021-10-13 LAB — CBG MONITORING, ED
Glucose-Capillary: 241 mg/dL — ABNORMAL HIGH (ref 70–99)
Glucose-Capillary: 221 mg/dL — ABNORMAL HIGH (ref 70–99)
Glucose-Capillary: 196 mg/dL — ABNORMAL HIGH (ref 70–99)

## 2021-10-13 LAB — CBC
HCT: 44 % (ref 39.0–52.0)
Hemoglobin: 14.9 g/dL (ref 13.0–17.0)
MCH: 28.4 pg (ref 26.0–34.0)
MCHC: 33.9 g/dL (ref 30.0–36.0)
MCV: 84 fL (ref 80.0–100.0)
Platelets: 312 10*3/uL (ref 150–400)
RBC: 5.24 MIL/uL (ref 4.22–5.81)
RDW: 12.1 % (ref 11.5–15.5)
WBC: 12 10*3/uL — ABNORMAL HIGH (ref 4.0–10.5)
nRBC: 0 % (ref 0.0–0.2)

## 2021-10-13 LAB — HEPATIC FUNCTION PANEL
ALT: 26 U/L (ref 0–44)
AST: 17 U/L (ref 15–41)
Albumin: 4.1 g/dL (ref 3.5–5.0)
Alkaline Phosphatase: 79 U/L (ref 38–126)
Bilirubin, Direct: 0.1 mg/dL (ref 0.0–0.2)
Indirect Bilirubin: 1 mg/dL — ABNORMAL HIGH (ref 0.3–0.9)
Total Bilirubin: 1.1 mg/dL (ref 0.3–1.2)
Total Protein: 7.6 g/dL (ref 6.5–8.1)

## 2021-10-13 LAB — BASIC METABOLIC PANEL
Anion gap: 10 (ref 5–15)
BUN: 17 mg/dL (ref 6–20)
CO2: 26 mmol/L (ref 22–32)
Calcium: 8.7 mg/dL — ABNORMAL LOW (ref 8.9–10.3)
Chloride: 96 mmol/L — ABNORMAL LOW (ref 98–111)
Creatinine, Ser: 0.86 mg/dL (ref 0.61–1.24)
GFR, Estimated: >60 mL/min (ref >60)
Glucose, Bld: 235 mg/dL — ABNORMAL HIGH (ref 70–99)
Potassium: 4 mmol/L (ref 3.5–5.1)
Sodium: 132 mmol/L — ABNORMAL LOW (ref 135–145)

## 2021-10-13 LAB — GLUCOSE, CAPILLARY
Glucose-Capillary: 238 mg/dL — ABNORMAL HIGH (ref 70–99)
Glucose-Capillary: 201 mg/dL — ABNORMAL HIGH (ref 70–99)

## 2021-10-13 LAB — RESP PANEL BY RT-PCR (FLU A&B, COVID) ARPGX2
Influenza A by PCR: NEGATIVE
Influenza B by PCR: NEGATIVE
SARS Coronavirus 2 by RT PCR: NEGATIVE

## 2021-10-13 LAB — LIPASE, BLOOD: Lipase: 36 U/L (ref 11–51)

## 2021-10-13 MED ORDER — SODIUM CHLORIDE 0.9 % IV BOLUS
1000.0000 mL | Freq: Once | INTRAVENOUS | Status: AC
  Administered 2021-10-13: 1000 mL via INTRAVENOUS

## 2021-10-13 MED ORDER — ONDANSETRON 4 MG PO TBDP
4.0000 mg | ORAL_TABLET | Freq: Once | ORAL | Status: AC
  Administered 2021-10-13: 4 mg via ORAL

## 2021-10-13 MED ORDER — INSULIN ASPART 100 UNIT/ML IJ SOLN
0.0000 [IU] | INTRAMUSCULAR | Status: DC
  Administered 2021-10-13: 5 [IU] via SUBCUTANEOUS
  Filled 2021-10-13: qty 1

## 2021-10-13 MED ORDER — PROMETHAZINE HCL 25 MG/ML IJ SOLN
12.5000 mg | Freq: Once | INTRAMUSCULAR | Status: AC
  Administered 2021-10-13: 12.5 mg via INTRAMUSCULAR

## 2021-10-13 MED ORDER — LACTATED RINGERS IV BOLUS
1000.0000 mL | Freq: Once | INTRAVENOUS | Status: AC
  Administered 2021-10-13: 1000 mL via INTRAVENOUS

## 2021-10-13 MED ORDER — PROCHLORPERAZINE EDISYLATE 10 MG/2ML IJ SOLN
10.0000 mg | Freq: Once | INTRAMUSCULAR | Status: AC
Start: 2021-10-13 — End: 2021-10-13
  Administered 2021-10-13: 10 mg via INTRAVENOUS
  Filled 2021-10-13: qty 2

## 2021-10-13 MED ORDER — MAGNESIUM SULFATE 2 GM/50ML IV SOLN
2.0000 g | Freq: Once | INTRAVENOUS | Status: AC
  Administered 2021-10-13: 2 g via INTRAVENOUS
  Filled 2021-10-13: qty 50

## 2021-10-13 MED ORDER — PROCHLORPERAZINE MALEATE 5 MG PO TABS
5.0000 mg | ORAL_TABLET | Freq: Three times a day (TID) | ORAL | 0 refills | Status: DC | PRN
Start: 2021-10-13 — End: 2022-11-20

## 2021-10-13 NOTE — ED Triage Notes (Signed)
See first nurse note- Pt to ER with n/v/hyperglycemia.

## 2021-10-13 NOTE — ED Triage Notes (Signed)
Pt comes into the ED via POV sent by UC for N/V, and hyperglycemia.  Pt is insulin dependent and recently also had his ozempic Rx changed to an increase.  Since the medication change, he has started having N/V, and ketones are present in his urine.  Pt received fluids and phenergan at urgent care.  CBG at [place] was 220.

## 2021-10-13 NOTE — ED Provider Notes (Signed)
[facility]    CSN: [id number] Arrival date & time: 10/13/21  1138      History   Chief Complaint No chief complaint on file.   HPI Christopher Polan. is a 41 y.o. male.   Subjective:   Christopher Riedl. is a 41 y.o. male who presents for evaluation of nausea and vomiting. Onset of symptoms was 2 days ago. Patient describes nausea as moderate. Vomiting has occurred several times over the past 2 days. Vomitus is described as liquid and dry heaves. Symptoms have been associated with diarrhea and the inability to keep down food/fluids. He has no appetite.  He also reports chills, diarrhea, low back pain and myalgias.  Symptoms seemed to have started shortly after his primary care provider increased his prescribed ozempic. Patient reports elevated glucose levels up to the 230s with no improvement despite insulin use. He denies alcohol overuse, hematemesis, melena, contacts with similar symptoms, recent antibiotics, recent travel, fever or abdominal pain.  No history of DKA. Symptoms have gradually improved today in comparison to just 1 day ago. Evaluation to date has been none. Treatment to date has been Zofran without any significant relief in symptoms.  He has been drinking body armor light, Gatorade and Pedialyte.  He took 2 home COVID test, 1 yesterday and another this morning which were both negative.  The following portions of the patient's history were reviewed and updated as appropriate: allergies, current medications, past family history, past medical history, past social history, past surgical history, and problem list.         Past Medical History:  Diagnosis Date   Diabetes mellitus without complication ([place])    Hypertension     Patient Active Problem List   Diagnosis Date Noted   Mixed diabetic hyperlipidemia associated with type 2 diabetes mellitus ([place]) 03/25/2021   Type 2 diabetes mellitus ([place]) 03/25/2021   Chronic pain of left ankle 03/17/2021    Erectile dysfunction 03/17/2021   Psoriasis 03/17/2021    Past Surgical History:  Procedure Laterality Date   FRACTURE SURGERY         Home Medications    Prior to Admission medications   Medication Sig Start Date End Date Taking? Authorizing Provider  augmented betamethasone dipropionate (DIPROLENE-AF) 0.05 % ointment Apply topically daily as needed. 04/01/21 04/01/22 Yes [provider]  Blood Glucose Monitoring Suppl (ONETOUCH VERIO) w/Device KIT Check blood glucose up to 4 times daily as directed. 04/11/21  Yes [provider]  chlorpheniramine-HYDROcodone ([place]) 10-8 MG/5ML SUER Take by mouth as needed. 07/07/21  Yes [provider]  diclofenac Sodium (VOLTAREN) 1 % GEL Apply topically as needed. 06/20/21  Yes [provider]  fluticasone (FLONASE) 50 MCG/ACT nasal spray as needed. 07/29/21  Yes [provider]  insulin aspart (FIASP FLEXTOUCH) 100 UNIT/ML FlexTouch Pen Inject into the skin.   Yes [provider]  Insulin Glargine w/ Trans Port (BASAGLAR TEMPO PEN) 100 UNIT/ML SOPN Inject into the skin.   Yes [provider]  meloxicam (MOBIC) 15 MG tablet Take by mouth. 06/20/21  Yes [provider]  OZEMPIC, 1 MG/DOSE, 4 MG/3ML SOPN Inject into the skin. 10/04/21  Yes [provider]  tadalafil (CIALIS) 5 MG tablet Take 1 tablet (5 mg total) by mouth as needed for erectile dysfunction. 03/29/21  Yes Billey Co, MD  diazepam (VALIUM) 5 MG tablet Take 1 tablet (5 mg total) by mouth once as needed for up to 1 dose (take 30 minutes  prior to vasectomy). 03/29/21   Billey Co, MD  insulin aspart (NOVOLOG) 100 UNIT/ML injection Inject into the skin.    [provider]  insulin glargine (LANTUS) 100 UNIT/ML Solostar Pen Inject into the skin. 03/17/21   [provider]  promethazine (PHENERGAN) 25 MG tablet Take 1 tablet (25 mg total) by mouth every 6 (six) hours as needed for nausea or  vomiting. 10/18/19 08/04/20  Fisher, Linden Dolin, PA-C    Family History Family History  Problem Relation Age of Onset   Cancer Mother    Diabetes Mother    Diabetes Father     Social History Social History   Tobacco Use   Smoking status: Never   Smokeless tobacco: Never  Vaping Use   Vaping Use: Never used  Substance Use Topics   Alcohol use: Not Currently   Drug use: Not Currently     Allergies   Levofloxacin   Review of Systems Review of Systems  Constitutional:  Positive for activity change, appetite change, chills, fatigue and fever.  Respiratory:  Negative for shortness of breath and wheezing.   Gastrointestinal:  Positive for diarrhea, nausea and vomiting. Negative for abdominal pain and blood in stool.  Genitourinary:  Negative for dysuria.  Musculoskeletal:  Positive for myalgias.  Neurological:  Negative for dizziness and headaches.  All other systems reviewed and are negative.   Physical Exam Triage Vital Signs ED Triage Vitals  Enc Vitals Group     BP 10/13/21 1149 (!) 157/89     Pulse Rate 10/13/21 1149 (!) 104     Resp 10/13/21 1149 18     Temp 10/13/21 1149 98.9 F (37.2 C)     Temp Source 10/13/21 1149 Oral     SpO2 10/13/21 1149 98 %     Weight 10/13/21 1151 170 lb (77.1 kg)     Height 10/13/21 1151 _0  (1.702 m)     Head Circumference --      Peak Flow --      Pain Score 10/13/21 1151 3     Pain Loc --      Pain Edu? --      Excl. in [place]? --    No data found.  Updated Vital Signs BP (!) 151/79    Pulse 94    Temp 98.3 F (36.8 C) (Oral)    Resp 16    Ht _1  (1.702 m)    Wt 170 lb (77.1 kg)    SpO2 100%    BMI 26.63 kg/m   Visual Acuity Right Eye Distance:   Left Eye Distance:   Bilateral Distance:    Right Eye Near:   Left Eye Near:    Bilateral Near:     Physical Exam Vitals reviewed.  Constitutional:      General: He is not in acute distress.    Appearance: He is normal weight. He is ill-appearing. He is not  toxic-appearing or diaphoretic.  HENT:     Head: Normocephalic.     Mouth/Throat:     Mouth: Mucous membranes are moist.  Cardiovascular:     Rate and Rhythm: Normal rate.  Pulmonary:     Effort: Pulmonary effort is normal.  Abdominal:     General: Bowel sounds are normal. There is no distension.     Palpations: Abdomen is soft.     Tenderness: There is no abdominal tenderness.  Musculoskeletal:        General: Normal range of motion.  Cervical back: Normal range of motion and neck supple.  Skin:    General: Skin is warm and dry.  Neurological:     General: No focal deficit present.     Mental Status: He is alert and oriented to person, place, and time.     UC Treatments / Results  Labs (all labs ordered are listed, but only abnormal results are displayed) Labs Reviewed  URINALYSIS, COMPLETE (UACMP) WITH MICROSCOPIC - Abnormal; Notable for the following components:      Result Value   Glucose, UA 100 (*)    Hgb urine dipstick TRACE (*)    Ketones, ur 40 (*)    Bacteria, UA RARE (*)    All other components within normal limits  GLUCOSE, CAPILLARY - Abnormal; Notable for the following components:   Glucose-Capillary 238 (*)    All other components within normal limits  URINALYSIS, COMPLETE (UACMP) WITH MICROSCOPIC - Abnormal; Notable for the following components:   Glucose, UA 100 (*)    Hgb urine dipstick TRACE (*)    Ketones, ur 80 (*)    Bacteria, UA RARE (*)    All other components within normal limits  GLUCOSE, CAPILLARY - Abnormal; Notable for the following components:   Glucose-Capillary 201 (*)    All other components within normal limits  CBG MONITORING, ED  CBG MONITORING, ED    EKG   Radiology No results found.  Procedures Procedures (including critical care time)  Medications Ordered in UC Medications  promethazine (PHENERGAN) injection 12.5 mg (12.5 mg Intramuscular Given 10/13/21 1213)  sodium chloride 0.9 % bolus 1,000 mL (0 mLs  Intravenous Stopped 10/13/21 1428)    Initial Impression / Assessment and Plan / UC Course  I have reviewed the triage vital signs and the nursing notes.  Pertinent labs & imaging results that were available during my care of the patient were reviewed by me and considered in my medical decision making (see chart for details).  Clinical Course as of 10/13/21 1518  Thu Oct 13, 2021  1249 Ketones, ur(!): 40 [SM]    Clinical Course User Index [SM] Enrique Sack, FNP    41 year old male with a history of insulin-dependent type II diabetes presents with acute nausea and vomiting for the past 2 days. He has been recently increased on his prescribed Ozempic. The symptoms started after the increase in this therapy.  He has no appetite as well as chills, diarrhea, low back pain and myalgias.  His glucose has been up to the 230s with no improvement despite insulin use.  He denies any fevers, alcohol overuse, hematemesis, melena, contacts with similar symptoms, recent antibiotic use, recent travel or abdominal pain. Patient completed two home COVID test which were negative.  On exam, patient appears acutely ill but nontoxic.  He is afebrile.  Vital signs stable.  Physical exam unremarkable.  Skin warm and dry.  Glucose 238.  Urinalysis shows glucose (100) and moderate ketones (40).  He was given Phenergan IM in the clinic with mild improvement in his nausea.   An IV was started and a liter of fluids were infused. Post-infusion glucose 201. Repeat urinalysis shows same amount of glucose (100) with worsening level of ketones (80). Patient reports continue nausea. No vomiting noted since arrival to urgent care.   At this point, I think that the patient has failed outpatient management and will need ED evaluation to r/o DKA and treat presenting symptoms. Patient and wife agreeable to proposed plan of care.  All  questions answered and all concerns addressed.  Patient to travel by private vehicle to [facility] Emergency Department.  Report called to Delilah Shan, RN.  Final Clinical Impressions(s) / UC Diagnoses   Final diagnoses:  Ketonuria  Nausea and vomiting, unspecified vomiting type  Dehydration  Uncontrolled type 2 diabetes mellitus with hyperglycemia, with long-term current use of insulin [facility])     Discharge Instructions      Go to [facility] Emergency Department for further evaluation and treatment.        ED Prescriptions   None    PDMP not reviewed this encounter.   Enrique Sack, [place] 10/13/21 [phone]

## 2021-10-13 NOTE — ED Notes (Signed)
Patient is being discharged from the Urgent Care and sent to the Emergency Department via POV . Per Lelon Mast NP, patient is in need of higher level of care due to nausea,vomiting and diabetes. Patient is aware and verbalizes understanding of plan of care.  Vitals:   10/13/21 1149 10/13/21 1351  BP: (!) 157/89 (!) 151/79  Pulse: (!) 104 94  Resp: 18 16  Temp: 98.9 F (37.2 C) 98.3 F (36.8 C)  SpO2: 98% 100%

## 2021-10-13 NOTE — ED Notes (Signed)
Insulin 5 units verified with Leeann Must

## 2021-10-13 NOTE — ED Provider Notes (Signed)
[facility] Provider Note    Event Date/Time   First MD Initiated Contact with Patient 10/13/21 1819     (approximate)   History   Hyperglycemia and Nausea   HPI  Christopher StallJackie Azam Jr. is a 41 y.o. male with a past medical history of HTN and DM as well as HDL in the ED who presents to the emergency room after being referred from urgent care for further evaluation management approximately 3 days of some nonbloody nonbilious vomiting and body aches.  Patient states he is worried he is in DKA.  He has not had any chest pain, cough, fevers, shortness of breath, back pain, urinary symptoms or significant abdominal pain.  States his symptoms started after Ozempic dose was increased.  He is also taking sliding scale and glargine but this regimen has not changed recently.  He denies any EtOH use, illicit drug use or tobacco use.  Denies any other acute concerns at this time.      Physical Exam  Triage Vital Signs: ED Triage Vitals  Enc Vitals Group     BP 10/13/21 1541 (!) 159/83     Pulse Rate 10/13/21 1541 (!) 105     Resp 10/13/21 1541 16     Temp 10/13/21 1541 98.9 F (37.2 C)     Temp Source 10/13/21 1541 Oral     SpO2 10/13/21 1541 97 %     Weight 10/13/21 1542 170 lb (77.1 kg)     Height 10/13/21 1542 5\' 7"  (1.702 m)     Head Circumference --      Peak Flow --      Pain Score 10/13/21 1541 0     Pain Loc --      Pain Edu? --      Excl. in [place]? --     Most recent vital signs: Vitals:   10/13/21 1541  BP: (!) 159/83  Pulse: (!) 105  Resp: 16  Temp: 98.9 F (37.2 C)  SpO2: 97%    General: Awake, no distress.  CV:  Slightly prolonged capillary refill..  2+ radial pulses.  Patient is slight tachycardic. Resp:  Normal effort.  Clear bilaterally. Abd:  No distention.  Soft throughout. Other:  Slightly dry mucous membranes.   ED Results / Procedures / Treatments  Labs (all labs ordered are listed, but only abnormal results are displayed) Labs  Reviewed  BASIC METABOLIC PANEL - Abnormal; Notable for the following components:      Result Value   Sodium 132 (*)    Chloride 96 (*)    Glucose, Bld 235 (*)    Calcium 8.7 (*)    All other components within normal limits  CBC - Abnormal; Notable for the following components:   WBC 12.0 (*)    All other components within normal limits  HEPATIC FUNCTION PANEL - Abnormal; Notable for the following components:   Indirect Bilirubin 1.0 (*)    All other components within normal limits  CBG MONITORING, ED - Abnormal; Notable for the following components:   Glucose-Capillary 241 (*)    All other components within normal limits  CBG MONITORING, ED - Abnormal; Notable for the following components:   Glucose-Capillary 221 (*)    All other components within normal limits  CBG MONITORING, ED - Abnormal; Notable for the following components:   Glucose-Capillary 196 (*)    All other components within normal limits  RESP PANEL BY RT-PCR (FLU A&B, COVID) ARPGX2  MAGNESIUM  LIPASE, BLOOD  HEMOGLOBIN A1C     EKG  EKG remarkable for sinus rhythm with a ventricular rate of 86, normal axis, unremarkable intervals without evidence of acute ischemia or significant arrhythmia.   RADIOLOGY   PROCEDURES:  Critical Care performed: No  Procedures    MEDICATIONS ORDERED IN ED: Medications  insulin aspart (novoLOG) injection 0-15 Units (5 Units Subcutaneous Given 10/13/21 1904)  magnesium sulfate IVPB 2 g 50 mL (has no administration in time range)  ondansetron (ZOFRAN-ODT) disintegrating tablet 4 mg (4 mg Oral Given 10/13/21 1549)  lactated ringers bolus 1,000 mL (1,000 mLs Intravenous New Bag/Given 10/13/21 1855)  prochlorperazine (COMPAZINE) injection 10 mg (10 mg Intravenous Given 10/13/21 1852)     IMPRESSION / MDM / ASSESSMENT AND PLAN / ED COURSE  I reviewed the triage vital signs and the nursing notes.                              Differential diagnosis includes, but is not  limited to DKA, HHS, acute viral infectious process, pancreatitis, electrolyte and metabolic derangements including possible kidney injury, possible gastroparesis versus side effect from increased dose of Ozempic.    I reviewed patient's prior records including most recent PCP visit on 10/6.  EKG remarkable for sinus rhythm with a ventricular rate of 86, normal axis, unremarkable intervals without evidence of acute ischemia or significant arrhythmia.  Low suspicion for atypical presentation for ACS.  Reviewed patient's capillary blood glucose and urinalysis drawn in urgent care.  CBG was noted to be 230 at that time.  UA was noted to have 100 glucose, trace hemoglobin ketones and rare bacteria without other evidence of blood or infection.  In emergency room patient's glucose is 235.  His potassium is normal as is his kidney function.  His bicarb is 26 and his anion gap is 10.  This is not consistent with DKA or HHS.  CBC shows WBC count of 12 somewhat nonspecific without evidence of acute anemia.  Hepatic function panel is unremarkable.  No evidence of hepatitis or cholestasis.  Lipase not consistent with acute pancreatitis.  Magnesium is 1.7 which is the lower end of normal and given patient is on multiple potentially QTC prolonging agents and is reasonable to get him closer to magnesium of 2.  We will give some supplementation in the emergency room.  QTC is appropriate at this time.  COVID and influenza PCR is negative.  On reassessment patient states he is feeling much better.  He is able tolerate p.o. without any significant difficulty.  Given stable vitals/reassuring exam work-up I think he is stable for discharge with continued outpatient evaluation.  Discussed holding his Ozempic until he can discuss with his PCP further.  Will write for short course of Compazine as it seems out of the 3 antiemetics he has tried today this helped the most.  Unclear if this is related to some gastroparesis versus  infectious gastritis versus side effects from Ozempic although I think patient is stable to this point for discharge outpatient follow-up.  Discharged in stable condition.  Strict return precautions advised and discussed.      FINAL CLINICAL IMPRESSION(S) / ED DIAGNOSES   Final diagnoses:  Dehydration  Nausea and vomiting, unspecified vomiting type  Hypertension, unspecified type     Rx / DC Orders   ED Discharge Orders          Ordered    prochlorperazine (COMPAZINE) 5  MG tablet  Every 8 hours PRN        10/13/21 2002             Note:  This document was prepared using Dragon voice recognition software and may include unintentional dictation errors.   Gilles Chiquito, MD 10/13/21 2004

## 2021-10-13 NOTE — ED Triage Notes (Signed)
Pt reports vomiting all day yesterday and continues today. Pt reports is diabetic, not able to keep anything down  Reports BS 220, no hx of DKA  Diarrhea yesterday, loose stools now  Pt started 1 mg of Ozempic 1 mg on Tuesday  Pt was on 0.25 & 0.5 mg and tolerated it well, not with the increase it has caused vomiting, common side affect of medication.

## 2021-10-13 NOTE — Discharge Instructions (Addendum)
Go to [facility] Emergency Department for further evaluation and treatment.

## 2021-10-14 LAB — HEMOGLOBIN A1C
Hgb A1c MFr Bld: 9.3 % — ABNORMAL HIGH (ref 4.8–5.6)
Mean Plasma Glucose: 220.21 mg/dL

## 2022-04-30 IMAGING — CR DG CHEST 2V
2 series · 2 of 2 positions shown · non-contrast
Comparison: None.

CLINICAL DATA: Cough

EXAM:
CHEST - 2 VIEW

[chest pa]
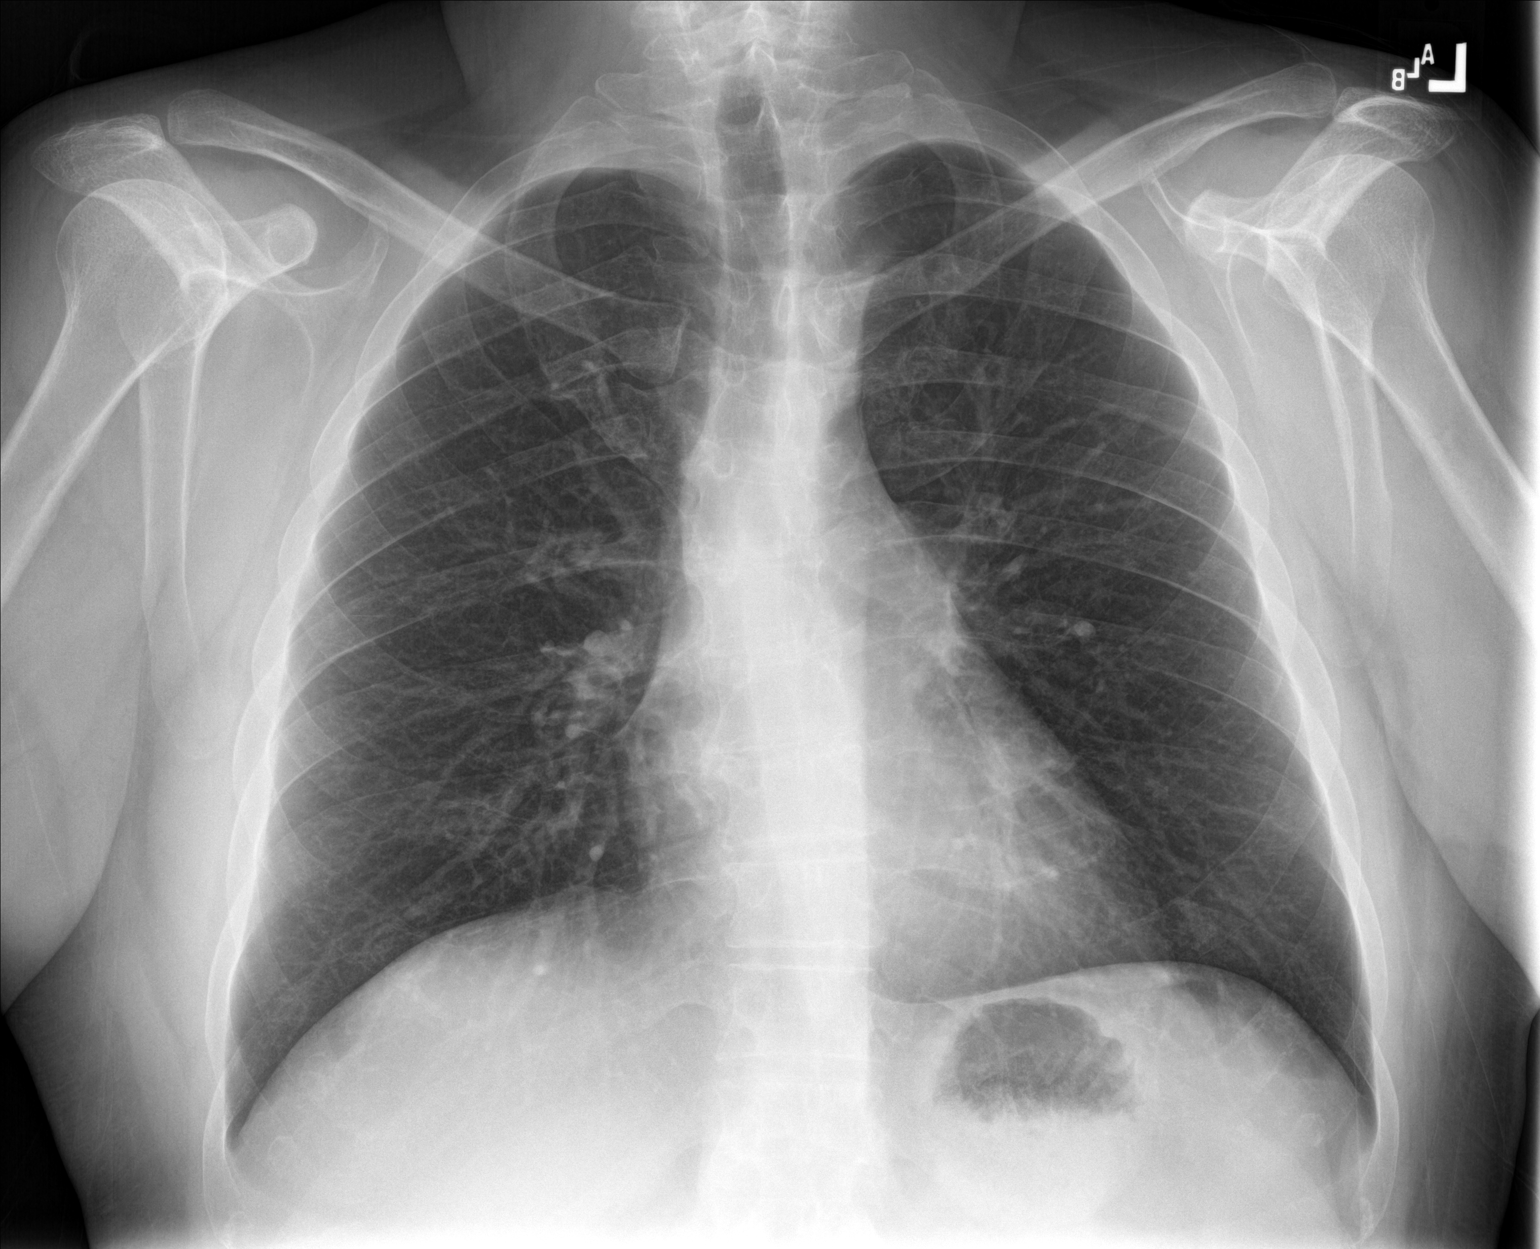

[chest lat]
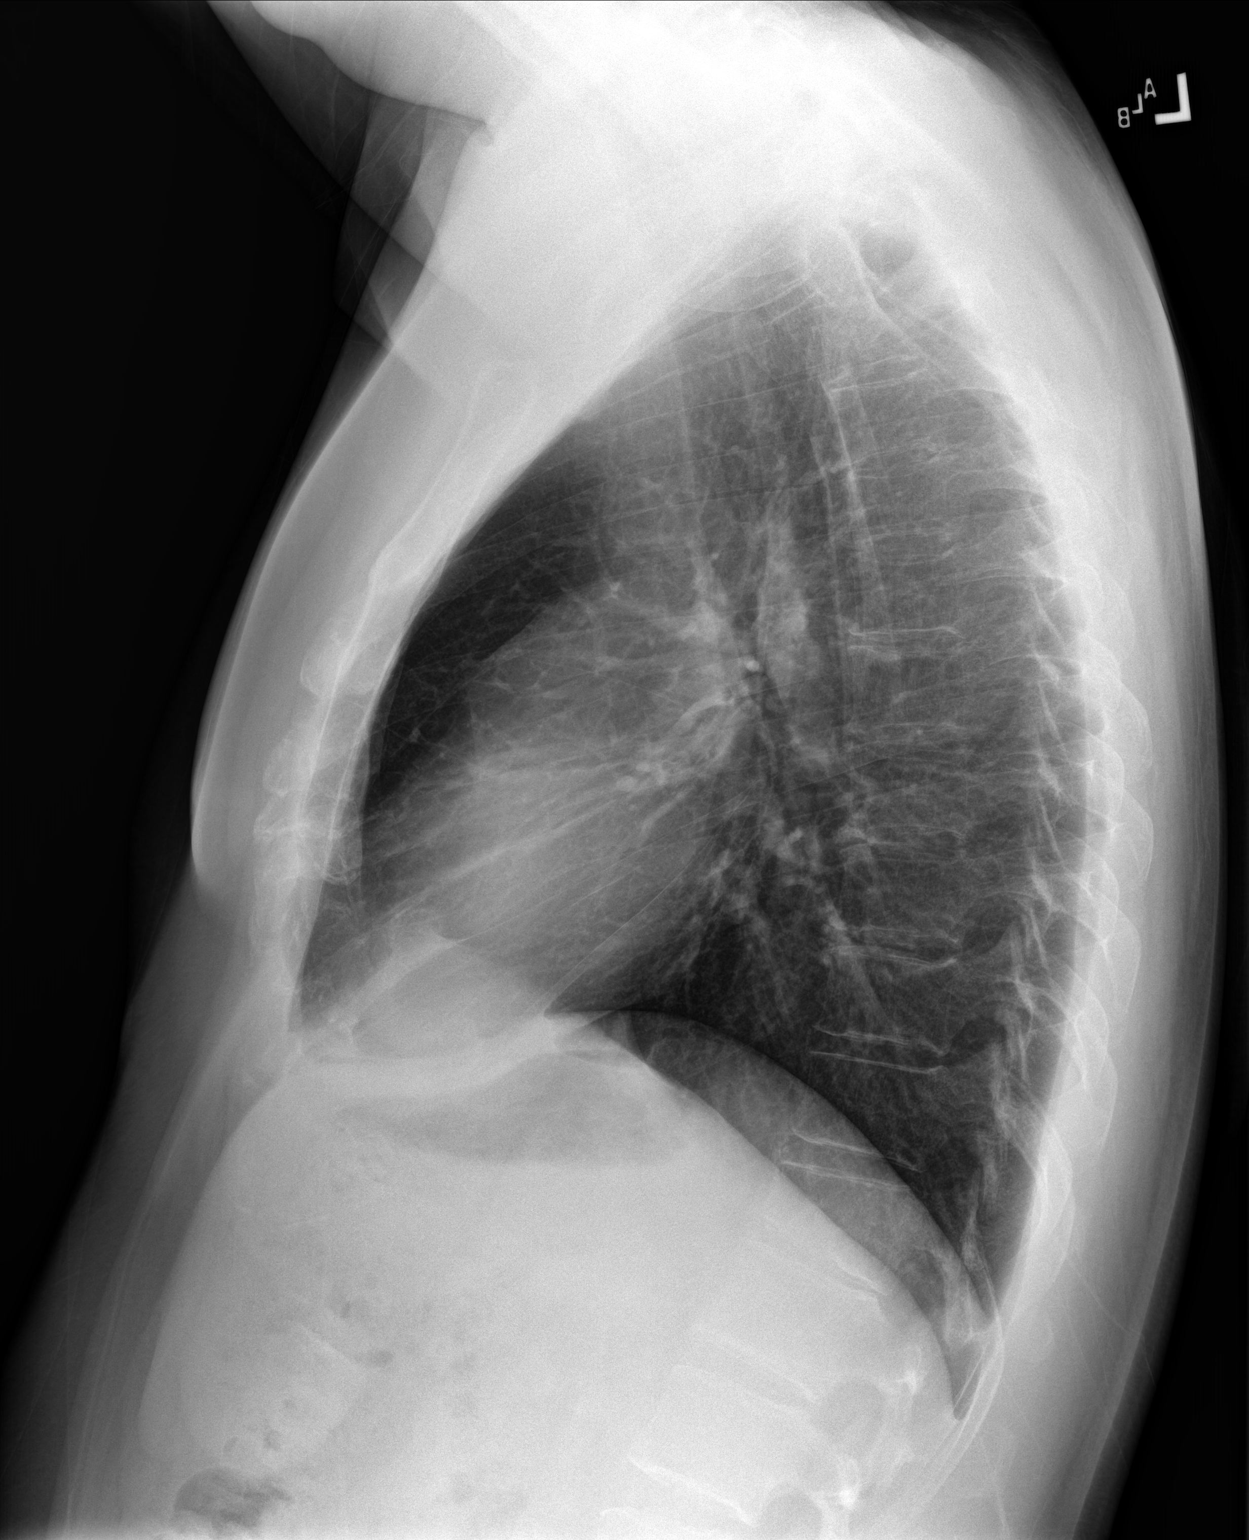

[2 of 2 positions shown; findings below may reference images not displayed]

FINDINGS: The heart size and mediastinal contours are within normal limits.
Both lungs are clear. No pleural effusion. The visualized skeletal
structures are unremarkable.
IMPRESSION: No acute process in the chest.

## 2022-10-18 ENCOUNTER — Other Ambulatory Visit: Payer: Self-pay | Admitting: Urology

## 2022-10-18 DIAGNOSIS — N5201 Erectile dysfunction due to arterial insufficiency: Secondary | ICD-10-CM

## 2022-10-28 ENCOUNTER — Other Ambulatory Visit: Payer: Self-pay

## 2022-10-28 ENCOUNTER — Emergency Department: Payer: No Typology Code available for payment source

## 2022-10-28 ENCOUNTER — Emergency Department
Admission: EM | Admit: 2022-10-28 | Discharge: 2022-10-28 | Disposition: A | Discharge status: Home / Self Care | Payer: No Typology Code available for payment source | Attending: Emergency Medicine | Admitting: Emergency Medicine

## 2022-10-28 DIAGNOSIS — N200 Calculus of kidney: Secondary | ICD-10-CM

## 2022-10-28 DIAGNOSIS — R109 Unspecified abdominal pain: Secondary | ICD-10-CM | POA: Diagnosis present

## 2022-10-28 DIAGNOSIS — N132 Hydronephrosis with renal and ureteral calculous obstruction: Secondary | ICD-10-CM | POA: Diagnosis not present

## 2022-10-28 DIAGNOSIS — Z1152 Encounter for screening for COVID-19: Secondary | ICD-10-CM | POA: Diagnosis not present

## 2022-10-28 DIAGNOSIS — E1165 Type 2 diabetes mellitus with hyperglycemia: Secondary | ICD-10-CM | POA: Insufficient documentation

## 2022-10-28 DIAGNOSIS — R739 Hyperglycemia, unspecified: Secondary | ICD-10-CM

## 2022-10-28 LAB — RESP PANEL BY RT-PCR (RSV, FLU A&B, COVID)  RVPGX2
Influenza A by PCR: NEGATIVE
Influenza B by PCR: NEGATIVE
Resp Syncytial Virus by PCR: NEGATIVE
SARS Coronavirus 2 by RT PCR: NEGATIVE

## 2022-10-28 LAB — URINALYSIS, ROUTINE W REFLEX MICROSCOPIC
Bacteria, UA: NONE SEEN
Bilirubin Urine: NEGATIVE
Glucose, UA: >=500 mg/dL — AB
Ketones, ur: 20 mg/dL — AB
Leukocytes,Ua: NEGATIVE
Nitrite: NEGATIVE
Protein, ur: NEGATIVE mg/dL
Specific Gravity, Urine: 1.018 (ref 1.005–1.030)
pH: 6 (ref 5.0–8.0)

## 2022-10-28 LAB — COMPREHENSIVE METABOLIC PANEL
ALT: 17 U/L (ref 0–44)
AST: 22 U/L (ref 15–41)
Albumin: 4.1 g/dL (ref 3.5–5.0)
Alkaline Phosphatase: 94 U/L (ref 38–126)
Anion gap: 12 (ref 5–15)
BUN: 22 mg/dL — ABNORMAL HIGH (ref 6–20)
CO2: 24 mmol/L (ref 22–32)
Calcium: 8.7 mg/dL — ABNORMAL LOW (ref 8.9–10.3)
Chloride: 96 mmol/L — ABNORMAL LOW (ref 98–111)
Creatinine, Ser: 1.17 mg/dL (ref 0.61–1.24)
GFR, Estimated: >60 mL/min (ref >60)
Glucose, Bld: 326 mg/dL — ABNORMAL HIGH (ref 70–99)
Potassium: 3.4 mmol/L — ABNORMAL LOW (ref 3.5–5.1)
Sodium: 132 mmol/L — ABNORMAL LOW (ref 135–145)
Total Bilirubin: 1.2 mg/dL (ref 0.3–1.2)
Total Protein: 8 g/dL (ref 6.5–8.1)

## 2022-10-28 LAB — CBC
HCT: 46.8 % (ref 39.0–52.0)
Hemoglobin: 15.6 g/dL (ref 13.0–17.0)
MCH: 28.2 pg (ref 26.0–34.0)
MCHC: 33.3 g/dL (ref 30.0–36.0)
MCV: 84.5 fL (ref 80.0–100.0)
Platelets: 319 10*3/uL (ref 150–400)
RBC: 5.54 MIL/uL (ref 4.22–5.81)
RDW: 12 % (ref 11.5–15.5)
WBC: 14.3 10*3/uL — ABNORMAL HIGH (ref 4.0–10.5)
nRBC: 0 % (ref 0.0–0.2)

## 2022-10-28 LAB — LIPASE, BLOOD: Lipase: 43 U/L (ref 11–51)

## 2022-10-28 MED ORDER — SODIUM CHLORIDE 0.9 % IV BOLUS
1000.0000 mL | Freq: Once | INTRAVENOUS | Status: AC
  Administered 2022-10-28: 1000 mL via INTRAVENOUS

## 2022-10-28 MED ORDER — ONDANSETRON HCL 4 MG/2ML IJ SOLN
4.0000 mg | Freq: Once | INTRAMUSCULAR | Status: AC
Start: 2022-10-28 — End: 2022-10-28
  Administered 2022-10-28: 4 mg via INTRAVENOUS
  Filled 2022-10-28: qty 2

## 2022-10-28 MED ORDER — NAPROXEN 500 MG PO TABS: 500.0000 mg | ORAL_TABLET | Freq: Two times a day (BID) | ORAL | 0 refills | Status: AC

## 2022-10-28 MED ORDER — TAMSULOSIN HCL 0.4 MG PO CAPS
0.4000 mg | ORAL_CAPSULE | Freq: Every day | ORAL | 0 refills | Status: AC
Start: 2022-10-28 — End: 2022-11-04

## 2022-10-28 MED ORDER — ONDANSETRON HCL 4 MG PO TABS: 4.0000 mg | ORAL_TABLET | Freq: Three times a day (TID) | ORAL | 0 refills | Status: DC | PRN

## 2022-10-28 MED ORDER — KETOROLAC TROMETHAMINE 15 MG/ML IJ SOLN
15.0000 mg | Freq: Once | INTRAMUSCULAR | Status: AC
Start: 2022-10-28 — End: 2022-10-28
  Administered 2022-10-28: 15 mg via INTRAVENOUS
  Filled 2022-10-28: qty 1

## 2022-10-28 NOTE — ED Notes (Signed)
Warm blanket provided.

## 2022-10-28 NOTE — ED Triage Notes (Signed)
Pt to Ed from home for vomiting. Pt does feel better today than yesterday. Pt states he feels feverish and chills but has not had a temperature. Pt is CAOx4 and in no acute distress and ambulatory in triage.

## 2022-10-28 NOTE — ED Provider Notes (Signed)
[facility] Provider Note    Event Date/Time   First MD Initiated Contact with Patient 10/28/22 1603     (approximate)   History   Emesis   HPI  Christopher Livingston. is a 42 y.o. male with a past medical history of diabetes, hyperlipidemia who presents today for evaluation of vomiting and diarrhea and left-sided flank pain that began yesterday.  Patient reports that his urine is dark.  He denies fevers or chills.  He has not had any chest pain or shortness of breath.  He denies burning with urination.  Patient Active Problem List   Diagnosis Date Noted   Mixed diabetic hyperlipidemia associated with type 2 diabetes mellitus ([place]) 03/25/2021   Type 2 diabetes mellitus ([place]) 03/25/2021   Chronic pain of left ankle 03/17/2021   Erectile dysfunction 03/17/2021   Psoriasis 03/17/2021         Physical Exam   Triage Vital Signs: ED Triage Vitals [10/28/22 1536]  Enc Vitals Group     BP (!) 155/87     Pulse Rate 98     Resp 16     Temp 98.6 F (37 C)     Temp Source Oral     SpO2 98 %     Weight 200 lb (90.7 kg)     Height 5\' 7"  (1.702 m)     Head Circumference      Peak Flow      Pain Score 0     Pain Loc      Pain Edu?      Excl. in [place]?     Most recent vital signs: Vitals:   10/28/22 1700 10/28/22 1730  BP: (!) 150/70 (!) 152/78  Pulse: 78 89  Resp:    Temp:    SpO2: 97% 96%    Physical Exam Vitals and nursing note reviewed.  Constitutional:      General: Awake and alert. No acute distress.    Appearance: Normal appearance. The patient is overweight.  HENT:     Head: Normocephalic and atraumatic.     Mouth: Mucous membranes are moist.  Eyes:     General: PERRL. Normal EOMs        Right eye: No discharge.        Left eye: No discharge.     Conjunctiva/sclera: Conjunctivae normal.  Cardiovascular:     Rate and Rhythm: Normal rate and regular rhythm.     Pulses: Normal pulses.     Heart sounds: Normal heart sounds Pulmonary:      Effort: Pulmonary effort is normal. No respiratory distress.     Breath sounds: Normal breath sounds.  Abdominal:     Abdomen is soft. There is no abdominal tenderness. No rebound or guarding. No distention.  Mild left CVA tenderness Musculoskeletal:        General: No swelling. Normal range of motion.     Cervical back: Normal range of motion and neck supple.  Skin:    General: Skin is warm and dry.     Capillary Refill: Capillary refill takes less than 2 seconds.     Findings: No rash.  Neurological:     Mental Status: The patient is awake and alert.      ED Results / Procedures / Treatments   Labs (all labs ordered are listed, but only abnormal results are displayed) Labs Reviewed  COMPREHENSIVE METABOLIC PANEL - Abnormal; Notable for the following components:      Result  Value   Sodium 132 (*)    Potassium 3.4 (*)    Chloride 96 (*)    Glucose, Bld 326 (*)    BUN 22 (*)    Calcium 8.7 (*)    All other components within normal limits  CBC - Abnormal; Notable for the following components:   WBC 14.3 (*)    All other components within normal limits  URINALYSIS, ROUTINE W REFLEX MICROSCOPIC - Abnormal; Notable for the following components:   Color, Urine YELLOW (*)    APPearance CLEAR (*)    Glucose, UA >=500 (*)    Hgb urine dipstick LARGE (*)    Ketones, ur 20 (*)    All other components within normal limits  RESP PANEL BY RT-PCR (RSV, FLU A&B, COVID)  RVPGX2  LIPASE, BLOOD     EKG     RADIOLOGY I independently reviewed and interpreted imaging and agree with radiologists findings.     PROCEDURES:  Critical Care performed:   Procedures   MEDICATIONS ORDERED IN ED: Medications  sodium chloride 0.9 % bolus 1,000 mL (1,000 mLs Intravenous New Bag/Given 10/28/22 1651)  ketorolac (TORADOL) 15 MG/ML injection 15 mg (15 mg Intravenous Given 10/28/22 1651)  ondansetron (ZOFRAN) injection 4 mg (4 mg Intravenous Given 10/28/22 1651)     IMPRESSION / MDM  / ASSESSMENT AND PLAN / ED COURSE  I reviewed the triage vital signs and the nursing notes.   Differential diagnosis includes, but is not limited to, nephrolithiasis, urinary tract infection, pyelonephritis, influenza.  No abdominal pain or tenderness to suggest diverticulitis, appendicitis, or cholelithiasis.  Patient is awake and alert, hemodynamically stable and afebrile.  He has leukocytosis to 14.  He is hyperglycemic, though no increased anion gap, normal bicarb, not consistent with diabetic ketoacidosis.  He was treated symptomatically with IV fluids, Zofran, and Toradol.  Urinalysis reveals hemoglobin without leukocytes, nitrites, or bacteria.  CT stone search reveals a 5 mm ureteral stone.  Upon reevaluation, patient reports significantly improved after Zofran and Toradol and fluids.  Recommended close outpatient follow-up with urology and the appropriate information was provided.  The meantime, patient was started on naproxen (advised to not take this with other NSAIDs), Zofran and Flomax.  We discussed return precautions and the importance of close outpatient follow-up.  Patient or stands and agrees with plan.  He was discharged in stable condition.   Patient's presentation is most consistent with acute complicated illness / injury requiring diagnostic workup.    FINAL CLINICAL IMPRESSION(S) / ED DIAGNOSES   Final diagnoses:  Left nephrolithiasis  Hyperglycemia     Rx / DC Orders   ED Discharge Orders          Ordered    tamsulosin (FLOMAX) 0.4 MG CAPS capsule  Daily        10/28/22 1746    naproxen (NAPROSYN) 500 MG tablet  2 times daily with meals        10/28/22 1746    ondansetron (ZOFRAN) 4 MG tablet  Every 8 hours PRN        10/28/22 1746             Note:  This document was prepared using Dragon voice recognition software and may include unintentional dictation errors.   Emeline Gins 10/28/22 1755    Rada Hay, MD 10/28/22 [phone]

## 2022-10-28 NOTE — ED Notes (Signed)
Discharge instructions explained to patient and family at this time. Patient and family state they understand and agree.   

## 2022-10-28 NOTE — Discharge Instructions (Addendum)
You have a left-sided kidney stone.  It currently does not appear to be infected.  You may take the medications as prescribed to help with your symptoms.  Please follow-up with urology.  Do not take the naproxen with any other NSAIDs.  Your blood sugar was also elevated, please follow-up with your outpatient provider regarding better glucose control.  Please return for any new, worsening, or change in symptoms or other concerns.  It was a pleasure caring for you today.

## 2022-10-31 ENCOUNTER — Other Ambulatory Visit: Payer: Self-pay

## 2022-10-31 DIAGNOSIS — N200 Calculus of kidney: Secondary | ICD-10-CM

## 2022-11-01 ENCOUNTER — Ambulatory Visit: Payer: No Typology Code available for payment source | Admitting: Urology

## 2022-11-01 ENCOUNTER — Ambulatory Visit
Admission: RE | Admit: 2022-11-01 | Discharge: 2022-11-01 | Disposition: A | Discharge status: Home / Self Care | Payer: No Typology Code available for payment source | Source: Ambulatory Visit | Attending: Urology

## 2022-11-01 ENCOUNTER — Encounter: Payer: Self-pay | Admitting: Urology

## 2022-11-01 ENCOUNTER — Other Ambulatory Visit: Payer: Self-pay | Admitting: Urology

## 2022-11-01 ENCOUNTER — Ambulatory Visit
Admission: RE | Admit: 2022-11-01 | Discharge: 2022-11-01 | Disposition: A | Discharge status: Home / Self Care | Payer: No Typology Code available for payment source | Attending: Urology | Admitting: Urology

## 2022-11-01 VITALS — BP 111/73 | HR 112 | Ht 67.0 in | Wt 191.6 lb

## 2022-11-01 DIAGNOSIS — N200 Calculus of kidney: Secondary | ICD-10-CM

## 2022-11-01 DIAGNOSIS — N529 Male erectile dysfunction, unspecified: Secondary | ICD-10-CM

## 2022-11-01 MED ORDER — PROCHLORPERAZINE MALEATE 5 MG PO TABS: 5.0000 mg | ORAL_TABLET | Freq: Four times a day (QID) | ORAL | 0 refills | Status: DC | PRN

## 2022-11-01 MED ORDER — TADALAFIL 10 MG PO TABS: 10.0000 mg | ORAL_TABLET | Freq: Every day | ORAL | 11 refills | Status: AC | PRN

## 2022-11-01 MED ORDER — KETOROLAC TROMETHAMINE 10 MG PO TABS: 10.0000 mg | ORAL_TABLET | Freq: Four times a day (QID) | ORAL | 0 refills | Status: DC | PRN

## 2022-11-01 NOTE — H&P (View-Only) (Signed)
   11/01/2022 11:51 AM   [name]. 05-Oct-1980 [id number]  Reason for visit: Left mid ureteral stone, history of ED  HPI: 42 year old male who I saw previously in June 2022 for evaluation of vasectomy as well as ED.  He opted to not pursue vasectomy at that time, and he was trialed on 5 to 10 mg of Cialis.  He had moderate improvement on those medications.  He was changed to Viagra by his PCP which has not helped his ED at all.  He developed severe left-sided flank pain and nausea/vomiting on Thursday 1/25.  He was seen in the ER where CT showed a 6 mm left mid ureteral stone with hydronephrosis, punctate left lower pole stone, labs were essentially benign and he was discharged with medical expulsive therapy and urology follow-up.  I personally viewed and interpreted those images, stone measures 750HU, 12cm SSD, clearly seen on KUB today and unchanged location.  He continues to have refractory nausea and vomiting as well as some left-sided flank pain.  He would like a refill of the Compazine, Zofran did not work for him.  He has only been able to tolerate minimal p.o. intake.  Currently using Celebrex primarily for pain control.  We discussed various treatment options for urolithiasis including observation with or without medical expulsive therapy, shockwave lithotripsy (SWL), ureteroscopy and laser lithotripsy with stent placement, and percutaneous nephrolithotomy.We discussed that management is based on stone size, location, density, patient co-morbidities, and patient preference. Stones <61mm in size have a >80% spontaneous passage rate. Data surrounding the use of tamsulosin for medical expulsive therapy is controversial, but meta analyses suggests it is most efficacious for distal stones between 5-52mm in size. Possible side effects include dizziness/lightheadedness, and retrograde ejaculation.SWL has a lower stone free rate in a single procedure, but also a lower complication rate  compared to ureteroscopy and avoids a stent and associated stent related symptoms. Possible complications include renal hematoma, steinstrasse, and need for additional treatment.Ureteroscopy with laser lithotripsy and stent placement has a higher stone free rate than SWL in a single procedure, however increased complication rate including possible infection, ureteral injury, bleeding, and stent related morbidity. Common stent related symptoms include dysuria, urgency/frequency, and flank pain.  After an extensive discussion of the risks and benefits of the above treatment options, the patient would like to proceed with left shockwave lithotripsy this week.  Stone is ~4cm from renal shadow, and okay to continue NSAIDs.  Compazine sent in for nausea, and Toradol for pain control.  In terms of his ED, Cialis was increased to 10 to 20 mg on demand    Billey Co, MD  [facility] [address] [place], [place] [address] [phone]

## 2022-11-01 NOTE — Progress Notes (Signed)
   11/01/2022 11:51 AM   Christopher Peppard Jr. 11/19/1980 [id number]  Reason for visit: Left mid ureteral stone, history of ED  HPI: 41-year-old male who I saw previously in June 2022 for evaluation of vasectomy as well as ED.  He opted to not pursue vasectomy at that time, and he was trialed on 5 to 10 mg of Cialis.  He had moderate improvement on those medications.  He was changed to Viagra by his PCP which has not helped his ED at all.  He developed severe left-sided flank pain and nausea/vomiting on Thursday 1/25.  He was seen in the ER where CT showed a 6 mm left mid ureteral stone with hydronephrosis, punctate left lower pole stone, labs were essentially benign and he was discharged with medical expulsive therapy and urology follow-up.  I personally viewed and interpreted those images, stone measures 750HU, 12cm SSD, clearly seen on KUB today and unchanged location.  He continues to have refractory nausea and vomiting as well as some left-sided flank pain.  He would like a refill of the Compazine, Zofran did not work for him.  He has only been able to tolerate minimal p.o. intake.  Currently using Celebrex primarily for pain control.  We discussed various treatment options for urolithiasis including observation with or without medical expulsive therapy, shockwave lithotripsy (SWL), ureteroscopy and laser lithotripsy with stent placement, and percutaneous nephrolithotomy.We discussed that management is based on stone size, location, density, patient co-morbidities, and patient preference. Stones <5mm in size have a >80% spontaneous passage rate. Data surrounding the use of tamsulosin for medical expulsive therapy is controversial, but meta analyses suggests it is most efficacious for distal stones between 5-10mm in size. Possible side effects include dizziness/lightheadedness, and retrograde ejaculation.SWL has a lower stone free rate in a single procedure, but also a lower complication rate  compared to ureteroscopy and avoids a stent and associated stent related symptoms. Possible complications include renal hematoma, steinstrasse, and need for additional treatment.Ureteroscopy with laser lithotripsy and stent placement has a higher stone free rate than SWL in a single procedure, however increased complication rate including possible infection, ureteral injury, bleeding, and stent related morbidity. Common stent related symptoms include dysuria, urgency/frequency, and flank pain.  After an extensive discussion of the risks and benefits of the above treatment options, the patient would like to proceed with left shockwave lithotripsy this week.  Stone is ~4cm from renal shadow, and okay to continue NSAIDs.  Compazine sent in for nausea, and Toradol for pain control.  In terms of his ED, Cialis was increased to 10 to 20 mg on demand    Christopher Mccorkle C Juda Toepfer, MD  [facility] [address], [address] [place], [place] [address] [phone]   

## 2022-11-01 NOTE — Progress Notes (Signed)
ESWL ORDER FORM  Expected date of procedure: 11/02/22  Surgeon:  Erlene Quan or Jeb Levering  Post op standing: 2-4wk follow up w/KUB prior  Anticoagulation/Aspirin/NSAID standing order: Okay to continue  Anesthesia standing order: MAC  VTE standing: SCD's  Dx: Left Ureteral Stone  Procedure: left Extracorporeal shock wave lithotripsy  CPT : 78676  Standing Order Set:   *NPO after mn, KUB  *NS 144m/hr, Keflex 5076mPO, Benadryl 2542mO, Valium 56m15m, Zofran 4mg 22m   Medications if other than standing orders:   NONE

## 2022-11-01 NOTE — Patient Instructions (Signed)
ESWL for Kidney Stones  Extracorporeal shock wave lithotripsy (ESWL) is a treatment that can help break up kidney stones that are too large to pass on their own.  This is a nonsurgical procedure that breaks up a kidney stone with shock waves. These shock waves pass through your body and focus on the kidney stone. They cause the kidney stone to break into smaller pieces (fragments) while it is still in the urinary tract. The fragments of stone can pass more easily out of your body in the urine. Tell a health care provider about: Any allergies you have. All medicines you are taking, including vitamins, herbs, eye drops, creams, and over-the-counter medicines. Any problems you or family members have had with anesthetic medicines. Any bleeding problems you have. Any surgeries you have had. Any medical conditions you have. Whether you are pregnant or may be pregnant. What are the risks? Your health care provider will talk with you about risks. These may include: Infection. Bleeding from the kidney. Bruising of the kidney or skin. Scarring of the kidney. This can lead to: Increased blood pressure. Poor kidney function. Return (recurrence) of kidney stones. Damage to other structures or organs. This may include the liver, colon, spleen, or pancreas. Blockage (obstruction) of the tube that carries urine from the kidney to the bladder (ureter). Failure of the kidney stone to break into fragments. What happens before the procedure? When to stop eating and drinking Follow instructions from your health care provider about what you may eat and drink. These may include: 8 hours before your procedure Stop eating most foods. Do not eat meat, fried foods, or fatty foods. Eat only light foods, such as toast or crackers. All liquids are okay except energy drinks and alcohol. 6 hours before your procedure Stop eating. Drink only clear liquids, such as water, clear fruit juice, black coffee, plain tea,  and sports drinks. Do not drink energy drinks or alcohol. 2 hours before your procedure Stop drinking all liquids. You may be allowed to take medicines with small sips of water. If you do not follow your health care provider's instructions, your procedure may be delayed or canceled. Medicines Ask your health care provider about: Changing or stopping your regular medicines. These include any diabetes medicines or blood thinners you take. Taking medicines such as aspirin and ibuprofen. These medicines can thin your blood. Do not take them unless your health care provider tells you to. Taking over-the-counter medicines, vitamins, herbs, and supplements. Tests You may have tests, such as: Blood tests. Urine tests. Imaging tests. This may include a CT scan. Surgery safety Ask your health care provider: How your surgery site will be marked. What steps will be taken to help prevent infection. These steps may include: Washing skin with a soap that kills germs. Receiving antibiotics. General instructions If you will be going home right after the procedure, plan to have a responsible adult: Take you home from the hospital or clinic. You will not be allowed to drive. Care for you for the time you are told. What happens during the procedure?  An IV will be inserted into one of your veins. You may be given: A sedative. This helps you relax. Anesthesia. This will: Numb certain areas of your body. Make you fall asleep for surgery. A water-filled cushion may be placed behind your kidney or on your abdomen. In some cases, you may be placed in a tub of lukewarm water. Your body will be positioned in a way that makes it   easier to target the kidney stone. An X-ray or ultrasound exam will be done to locate your stone. Shock waves will be aimed at the stone. If you are awake, you may feel a tapping sensation as the shock waves pass through your body. A small mesh tube (stent) may be placed in your  ureter. This will help keep urine flowing from the kidney if the fragments of the stone have been blocking the ureter. The stent will be removed at a later time by your health care provider. The procedure may vary among health care providers and hospitals. What happens after the procedure? Your blood pressure, heart rate, breathing rate, and blood oxygen level will be monitored until you leave the hospital or clinic. You may have an X-ray after the procedure to see how many of the kidney stones were broken up. This will also show how much of the stone has passed. If there are still large fragments after treatment, you may need to have a second procedure at a later time. This information is not intended to replace advice given to you by your health care provider. Make sure you discuss any questions you have with your health care provider. Document Revised: 01/19/2022 Document Reviewed: 01/19/2022 Elsevier Patient Education  [place].  ESWL for Kidney Stones, Care After The following information offers guidance on how to care for yourself after your procedure. Your health care provider may also give you more specific instructions. If you have problems or questions, contact your health care provider. What can I expect after the procedure? After the procedure, it is common to have: Some blood in your urine. This should only last for a few days. Soreness in your back, sides, or upper abdomen for a few days. Blotches or bruises on the area where the shock wave entered the skin. Pain, discomfort, or nausea when pieces (fragments) of the kidney stone move through the tube that carries urine from the kidney to the bladder (ureter). Fragments may pass soon after the procedure. They may also take up to 4-8 weeks to pass. If you have severe pain or nausea, contact your health care provider. This may be caused by a large stone that was not broken up enough. This may mean that you need more  treatment. Some pain or discomfort during urination. Some pain or discomfort in the lower abdomen or at the base of the penis. Follow these instructions at home: Medicines  Take over-the-counter and prescription medicines only as told by your health care provider. If you were prescribed antibiotics, take them as told by your health care provider. Do not stop using the antibiotic even if you start to feel better. Ask your health care provider if the medicine prescribed to you: Requires you to avoid driving or using machinery. Can cause constipation. You may need to take these actions to prevent or treat constipation: Take over-the-counter or prescription medicines. Eat foods that are high in fiber, such as beans, whole grains, and fresh fruits and vegetables. Limit foods that are high in fat and processed sugars, such as fried or sweet foods. Eating and drinking  Follow instructions from your health care provider about what you may eat and drink. You may be told to: Reduce how much salt (sodium) you eat or drink. Check ingredients and nutrition facts on packaged foods and drinks to see how much sodium they contain. Reduce how much meat you eat. Drink enough fluid to keep your urine pale yellow. This can help you pass  any pieces of the stone that are left. It can also prevent new stones from forming. Eat plenty of fresh fruits and vegetables. Eat the recommended amount of calcium for your age and gender. Ask your health care provider how much calcium you should have. Activity Get plenty of rest as told by your health care provider. Avoid sitting for a long time without moving. Get up to take short walks every 1-2 hours. This is important to improve blood flow and breathing. Ask for help if you feel weak or unsteady. Your health care provider may tell you to lie in a certain position (postural drainage) and tap firmly (percuss) over your kidney area to help stone fragments pass. Follow  instructions as told by your health care provider. Return to your normal activities as told by your health care provider. Ask your health care provider what activities are safe for you. Most people can resume normal activities 1-2 days after the procedure. General instructions If told, strain all urine through the strainer that was provided by your health care provider. Keep all fragments for your health care provider to see. Any stones that are found may be sent to a medical lab for examination. The stone may be as small as a grain of salt. Keep all follow-up visits. This is important if you had a stent placed because it may need to stay in place for a few weeks. Ask your health care provider when the stent will be removed. Contact a health care provider if: You have a fever or chills. You have severe nausea that leads to persistent vomiting. You have any of these urinary symptoms: Increased blood or blood clots in the urine. Urine that smells bad or unusual. A strong urge to urinate after emptying your bladder. Pain or burning with urination that does not go away. A continued need to urinate more often than usual. You have a stent, and it comes out. Get help right away if: You have severe pain in your back, sides, or upper abdomen. You faint. You have any of these urinary symptoms: Severe pain while urinating. More blood in your urine, or blood in your urine when you did not have any before. Blood clots in your urine larger than 1 inch (2.5 cm) in size. You pass only a small amount of urine when you urinate or are unable to pass any urine. This information is not intended to replace advice given to you by your health care provider. Make sure you discuss any questions you have with your health care provider. Document Revised: 01/19/2022 Document Reviewed: 01/19/2022 Elsevier Patient Education  [place].

## 2022-11-02 ENCOUNTER — Encounter: Payer: Self-pay | Admitting: Urology

## 2022-11-02 ENCOUNTER — Encounter
Admission: RE | Disposition: A | Discharge status: Home / Self Care | Payer: Self-pay | Source: Ambulatory Visit | Attending: Urology

## 2022-11-02 ENCOUNTER — Ambulatory Visit
Admission: RE | Admit: 2022-11-02 | Discharge: 2022-11-02 | Disposition: A | Discharge status: Home / Self Care | Payer: No Typology Code available for payment source | Source: Ambulatory Visit | Attending: Urology | Admitting: Urology

## 2022-11-02 ENCOUNTER — Ambulatory Visit: Payer: No Typology Code available for payment source

## 2022-11-02 ENCOUNTER — Other Ambulatory Visit: Payer: Self-pay

## 2022-11-02 DIAGNOSIS — N202 Calculus of kidney with calculus of ureter: Secondary | ICD-10-CM | POA: Insufficient documentation

## 2022-11-02 DIAGNOSIS — N200 Calculus of kidney: Secondary | ICD-10-CM

## 2022-11-02 HISTORY — PX: EXTRACORPOREAL SHOCK WAVE LITHOTRIPSY: SHX1557

## 2022-11-02 LAB — GLUCOSE, CAPILLARY
Glucose-Capillary: 218 mg/dL — ABNORMAL HIGH (ref 70–99)
Glucose-Capillary: 172 mg/dL — ABNORMAL HIGH (ref 70–99)

## 2022-11-02 SURGERY — LITHOTRIPSY, ESWL
Anesthesia: Moderate Sedation | Laterality: Left

## 2022-11-02 MED ORDER — CEPHALEXIN 500 MG PO CAPS
ORAL_CAPSULE | ORAL | Status: AC
  Administered 2022-11-02: 500 mg via ORAL
  Filled 2022-11-02: qty 1

## 2022-11-02 MED ORDER — DIPHENHYDRAMINE HCL 25 MG PO CAPS: 25.0000 mg | ORAL_CAPSULE | ORAL | Status: AC

## 2022-11-02 MED ORDER — CEPHALEXIN 500 MG PO CAPS: 500.0000 mg | ORAL_CAPSULE | Freq: Once | ORAL | Status: AC

## 2022-11-02 MED ORDER — DIAZEPAM 5 MG PO TABS: 10.0000 mg | ORAL_TABLET | ORAL | Status: AC

## 2022-11-02 MED ORDER — DIPHENHYDRAMINE HCL 25 MG PO CAPS
ORAL_CAPSULE | ORAL | Status: AC
  Administered 2022-11-02: 25 mg via ORAL
  Filled 2022-11-02: qty 1

## 2022-11-02 MED ORDER — SODIUM CHLORIDE 0.9 % IV SOLN: INTRAVENOUS | Status: DC

## 2022-11-02 MED ORDER — DIAZEPAM 5 MG PO TABS
ORAL_TABLET | ORAL | Status: AC
  Administered 2022-11-02: 10 mg via ORAL
  Filled 2022-11-02: qty 2

## 2022-11-02 MED ORDER — ONDANSETRON HCL 4 MG/2ML IJ SOLN
INTRAMUSCULAR | Status: AC
  Administered 2022-11-02: 4 mg via INTRAVENOUS
  Filled 2022-11-02: qty 2

## 2022-11-02 MED ORDER — ONDANSETRON HCL 4 MG/2ML IJ SOLN: 4.0000 mg | Freq: Once | INTRAMUSCULAR | Status: AC

## 2022-11-02 NOTE — Discharge Instructions (Addendum)
See [facility] discharge instructions in chart.    AMBULATORY SURGERY  DISCHARGE INSTRUCTIONS   The drugs that you were given will stay in your system until tomorrow so for the next 24 hours you should not:  Drive an automobile Make any legal decisions Drink any alcoholic beverage   You may resume regular meals tomorrow.  Today it is better to start with liquids and gradually work up to solid foods.  You may eat anything you prefer, but it is better to start with liquids, then soup and crackers, and gradually work up to solid foods.   Please notify your doctor immediately if you have any unusual bleeding, trouble breathing, redness and pain at the surgery site, drainage, fever, or pain not relieved by medication.     Additional Instructions:  

## 2022-11-02 NOTE — Interval H&P Note (Signed)
History and Physical Interval Note:  11/02/2022 11:59 AM  Christopher Livingston.  has presented today for surgery, with the diagnosis of Left Ureteral Stone.  The various methods of treatment have been discussed with the patient and family. After consideration of risks, benefits and other options for treatment, the patient has consented to  Procedure(s): EXTRACORPOREAL SHOCK WAVE LITHOTRIPSY (ESWL) (Left) as a surgical intervention.  The patient's history has been reviewed, patient examined, no change in status, stable for surgery.  I have reviewed the patient's chart and labs.  Questions were answered to the patient's satisfaction.    RRR CTAB   Hollice Espy

## 2022-11-02 NOTE — Progress Notes (Signed)
[employer] truck notified of potential dose of toradol 1/31 at 12 noon

## 2022-11-03 ENCOUNTER — Encounter: Payer: Self-pay | Admitting: Urology

## 2022-11-03 ENCOUNTER — Other Ambulatory Visit: Payer: Self-pay

## 2022-11-03 DIAGNOSIS — N200 Calculus of kidney: Secondary | ICD-10-CM

## 2022-11-06 ENCOUNTER — Telehealth: Payer: Self-pay | Admitting: Family Medicine

## 2022-11-06 NOTE — Telephone Encounter (Signed)
LMOM for patient to return call. He called after hours line stating he was having vomiting post surgery. After hours nurse advised that he go to ER. I called to see how patient is today. I do not see where he went to ED.

## 2022-11-16 NOTE — Progress Notes (Unsigned)
11/20/2022 11:17 AM   Christopher Livingston. 1980/10/31 [id number]  Referring provider: Sallee Lange, NP [address] [place],  [place] [address]  Urological history: 1.  Undesired fertility -had a vas consult (2022)  2. ED -Contributing factors of age, BPH, hypertension, diabetes and hyperlipidemia -tadalafil 10 mg, on-demand-dosing  Chief Complaint  Patient presents with   Nephrolithiasis    HPI: Christopher Livingston. is a 42 y.o. who is status post ESWL who presents today for follow up.  Underwent ESWL on 11/02/2022 for left ureteral stone with Dr. Erlene Quan.  Their postprocedural course was as expected and uneventful.   He did state he had a lot of nausea and anxiety after the procedure, but he was able to get that under control.  They have not passed fragments.   He is not have any further pain.  KUB the left ureteral stone is no longer visible on today's film.   UA yellow clear, specific gravity 1.025, pH 5.5, small bilirubin, 15 ketones, 0-5 WBCs, rare bacteria and mucus present.   He is interested in having a vasectomy at this time because he has met his deductible.    PMH: Past Medical History:  Diagnosis Date   Diabetes mellitus without complication ([facility])    Hypertension     Surgical History: Past Surgical History:  Procedure Laterality Date   EXTRACORPOREAL SHOCK WAVE LITHOTRIPSY Left 11/02/2022   Procedure: EXTRACORPOREAL SHOCK WAVE LITHOTRIPSY (ESWL);  Surgeon: Hollice Espy, MD;  Location: [facility] ORS;  Service: Urology;  Laterality: Left;   FRACTURE SURGERY      Home Medications:  Current Outpatient Medications on File Prior to Visit  Medication Sig Dispense Refill   ammonium lactate (AMLACTIN) 12 % cream Apply topically as needed for dry skin.     augmented betamethasone dipropionate (DIPROLENE-AF) 0.05 % ointment Apply topically as needed.     Blood Glucose Monitoring Suppl (ONETOUCH VERIO) w/Device KIT Check blood glucose up to 4 times daily as  directed.     celecoxib (CELEBREX) 200 MG capsule Take 200 mg by mouth 2 (two) times daily.     chlorpheniramine-HYDROcodone ([place]) 10-8 MG/5ML SUER Take by mouth as needed.     Continuous Blood Gluc Receiver (DEXCOM G7 RECEIVER) DEVI USE 1 DEVICE CONTINUOUSLY     Continuous Blood Gluc Sensor (DEXCOM G7 SENSOR) MISC Use 1 Device continuously     Continuous Blood Gluc Transmit (DEXCOM G6 TRANSMITTER) MISC USE AS DIRECTED TO MONITOR BLOOD GLUCOSE     Cysteamine Bitartrate (PROCYSBI) 300 MG PACK as needed.     diclofenac Sodium (VOLTAREN) 1 % GEL Apply 2 g topically as needed.     escitalopram (LEXAPRO) 20 MG tablet Take 1 tablet by mouth daily.     FIASP FLEXTOUCH 100 UNIT/ML FlexTouch Pen Inject into the skin.     fluticasone (FLONASE) 50 MCG/ACT nasal spray as needed.     Insulin Disposable Pump (V-GO 20) 20 UNIT/24HR KIT USE 1 DEVICE DAILY     Insulin Glargine w/ Trans Port (BASAGLAR TEMPO PEN) 100 UNIT/ML SOPN Inject into the skin.     Insulin Pen Needle (PEN NEEDLES 31GX5/16") 31G X 8 MM MISC Use 4 (four) times daily For use 4 times a day with basaglar and fiasp     Lancets (ONETOUCH DELICA PLUS Q000111Q) MISC CHECK BLOOD GLUCOSE UP TO 4 TIMES DAILY AS DIRECTED.     Multiple Vitamin (MULTIVITAMIN ADULT PO) Take 1 tablet by mouth 2 (two) times daily.  NOVOLOG 100 UNIT/ML injection Inject into the skin.     ONETOUCH VERIO test strip CHECK BLOOD GLUCOSE UP TO 5 TIMES DAILY AS DIRECTED     tadalafil (CIALIS) 10 MG tablet Take 1-2 tablets (10-20 mg total) by mouth daily as needed for erectile dysfunction. 30 tablet 11   [DISCONTINUED] promethazine (PHENERGAN) 25 MG tablet Take 1 tablet (25 mg total) by mouth every 6 (six) hours as needed for nausea or vomiting. 30 tablet 0   No current facility-administered medications on file prior to visit.    Allergies:  Allergies  Allergen Reactions   Levofloxacin Other (See Comments)    "It makes my body heat up" Other reaction(s): Other (See  Comments) Levaquin IV causes systemic burning sensation. Okay to take oral Levaquin.    Family History: Family History  Problem Relation Age of Onset   Cancer Mother    Diabetes Mother    Diabetes Father     Social History:  reports that he has never smoked. He has never used smokeless tobacco. He reports that he does not currently use alcohol. He reports that he does not currently use drugs.  ROS: Pertinent ROS in HPI  Physical Exam: BP 124/76 (BP Location: Left Arm, Patient Position: Sitting, Cuff Size: Normal)   Pulse (!) 102   Ht 5' 7"$  (1.702 m)   Wt 192 lb (87.1 kg)   SpO2 95%   BMI 30.07 kg/m   Constitutional:  Well nourished. Alert and oriented, No acute distress. HEENT: [place] AT, mask in place.   Trachea midline. Cardiovascular: No clubbing, cyanosis, or edema. Respiratory: Normal respiratory effort, no increased work of breathing. Neurologic: Grossly intact, no focal deficits, moving all 4 extremities. Psychiatric: Normal mood and affect.  Laboratory Data: Lab Results  Component Value Date   WBC 14.3 (H) 10/28/2022   HGB 15.6 10/28/2022   HCT 46.8 10/28/2022   MCV 84.5 10/28/2022   PLT 319 10/28/2022    Lab Results  Component Value Date   CREATININE 1.17 10/28/2022   Urinalysis    Component Value Date/Time   COLORURINE YELLOW 11/20/2022 1010   APPEARANCEUR CLEAR 11/20/2022 1010   LABSPEC 1.025 11/20/2022 1010   PHURINE 5.5 11/20/2022 1010   GLUCOSEU NEGATIVE 11/20/2022 1010   HGBUR NEGATIVE 11/20/2022 1010   BILIRUBINUR SMALL (A) 11/20/2022 1010   KETONESUR 15 (A) 11/20/2022 1010   PROTEINUR NEGATIVE 11/20/2022 1010   NITRITE NEGATIVE 11/20/2022 1010   LEUKOCYTESUR NEGATIVE 11/20/2022 1010  I have reviewed the labs.   Pertinent Imaging: Narrative & Impression  CLINICAL DATA:  History of left ureteral stone   EXAM: ABDOMEN - 1 VIEW   COMPARISON:  11/02/2022   FINDINGS: Previously seen left ureteral stone is no longer identified. This  is consistent with the given clinical history of lithotripsy. No renal calculi are noted. Scattered large and small bowel gas is noted. No free air is noted. No bony abnormality is seen.   IMPRESSION: Previously seen left ureteral stone has resolved following lithotripsy.     Electronically Signed   By: Inez Catalina M.D.   On: 11/21/2022 23:45    I have independently reviewed the films.    Assessment & Plan:    1. Left ureteral stone -No longer visible on today's KUB -Given the ABCs of stone prevention handout -He would like to be through a [id number] metabolic workup pending insurance coverage  2. Microscopic hematuria -UA negative for micro heme  3.  Undesired fertility -He is wanting to  proceed with a vasectomy at this time that he has met his insurance deduction -Messages sent to Dr. Diamantina Providence to see if he would want to see him in clinic prior to scheduling  Return if symptoms worsen or fail to improve.  These notes generated with voice recognition software. I apologize for typographical errors.  [place], [place] [address]  [place], [place] [address] [phone]

## 2022-11-20 ENCOUNTER — Ambulatory Visit (INDEPENDENT_AMBULATORY_CARE_PROVIDER_SITE_OTHER): Payer: No Typology Code available for payment source | Admitting: Urology

## 2022-11-20 ENCOUNTER — Ambulatory Visit
Admission: RE | Admit: 2022-11-20 | Discharge: 2022-11-20 | Disposition: A | Discharge status: Home / Self Care | Payer: No Typology Code available for payment source | Source: Ambulatory Visit | Attending: Urology

## 2022-11-20 ENCOUNTER — Ambulatory Visit
Admission: RE | Admit: 2022-11-20 | Discharge: 2022-11-20 | Disposition: A | Discharge status: Home / Self Care | Payer: No Typology Code available for payment source | Attending: Urology | Admitting: Urology

## 2022-11-20 ENCOUNTER — Other Ambulatory Visit: Payer: Self-pay

## 2022-11-20 ENCOUNTER — Other Ambulatory Visit
Admission: RE | Admit: 2022-11-20 | Discharge: 2022-11-20 | Disposition: A | Discharge status: Home / Self Care | Payer: No Typology Code available for payment source | Source: Home / Self Care | Attending: Urology | Admitting: Urology

## 2022-11-20 ENCOUNTER — Encounter: Payer: Self-pay | Admitting: Urology

## 2022-11-20 VITALS — BP 124/76 | HR 102 | Ht 67.0 in | Wt 192.0 lb

## 2022-11-20 DIAGNOSIS — R3129 Other microscopic hematuria: Secondary | ICD-10-CM

## 2022-11-20 DIAGNOSIS — N200 Calculus of kidney: Secondary | ICD-10-CM | POA: Diagnosis present

## 2022-11-20 DIAGNOSIS — N201 Calculus of ureter: Secondary | ICD-10-CM

## 2022-11-20 LAB — URINALYSIS, COMPLETE (UACMP) WITH MICROSCOPIC
Glucose, UA: NEGATIVE mg/dL
Hgb urine dipstick: NEGATIVE
Ketones, ur: 15 mg/dL — AB
Leukocytes,Ua: NEGATIVE
Nitrite: NEGATIVE
Protein, ur: NEGATIVE mg/dL
RBC / HPF: NONE SEEN RBC/hpf (ref 0–5)
Specific Gravity, Urine: 1.025 (ref 1.005–1.030)
Squamous Epithelial / HPF: NONE SEEN /HPF (ref 0–5)
pH: 5.5 (ref 5.0–8.0)

## 2022-11-24 ENCOUNTER — Telehealth: Payer: Self-pay | Admitting: Urology

## 2022-11-24 NOTE — Telephone Encounter (Signed)
Insurance will likely only cover 6 tablets per month, however prior auth completed via covermymeds awaiting response.

## 2022-11-24 NOTE — Telephone Encounter (Signed)
Patient called and stated that his insurance requires a prior authorization in order to cover Tadalafil 10 mg tablets. He has not requested this before since he had a deductible to meet. He has now met that, and wants insurance to pay for it. Please advise.  [facility] [place].

## 2022-11-27 ENCOUNTER — Telehealth: Payer: Self-pay

## 2022-11-27 NOTE — Telephone Encounter (Signed)
-----   Message from Nori Riis, PA-C sent at 11/21/2022  9:02 AM EST ----- Regarding: FW: Vasectomy We can go ahead and schedule him for a vasectomy.   ----- Message ----- From: Billey Co, MD Sent: 11/21/2022   7:52 AM EST To: Nori Riis, PA-C Subject: RE: Vasectomy                                  No, ok to schedule vas ----- Message ----- From: Nori Riis, PA-C Sent: 11/20/2022  11:12 AM EST To: Billey Co, MD Subject: Vasectomy                                      You saw him in 03/2021 for a vas consult.  He has met his deductible at this time and would like to schedule.  Do you need to see him again in clinic?

## 2022-11-27 NOTE — Telephone Encounter (Signed)
Please call patient to cancel vas consult as he does not need a consult appt per [facility]. He needs to be scheduled for a vasectomy only, thanks.

## 2022-11-28 NOTE — Telephone Encounter (Signed)
Sounds like a plan, I don't handle insurance so whatever needs to be done is fine with me. Thanks.

## 2022-12-04 NOTE — Telephone Encounter (Signed)
PA for tadalafil denied, insurance will only cover 6 tablets per month.

## 2022-12-05 ENCOUNTER — Telehealth: Payer: Self-pay | Admitting: Urology

## 2022-12-05 NOTE — Telephone Encounter (Signed)
I spoke with patient today and scheduled his Vasectomy for 01/09/23 in [place]. He would like to have [facility] [place]. He also said that he received a letter from [facility] stating that they will only cover Tadalafil dosage of 18 tablets in 75 days. Patient said Dr. Diamantina Providence wanted him to start off with 2 per day at first. Insurance needs to know why, and for how long this will be needed. Also, patient states he is diabetic and this contributes to his ED. He asked if that might help with an appeal to insurance. Please advise.

## 2022-12-06 ENCOUNTER — Other Ambulatory Visit: Payer: Self-pay | Admitting: Urology

## 2022-12-06 ENCOUNTER — Ambulatory Visit: Payer: No Typology Code available for payment source | Admitting: Urology

## 2022-12-06 MED ORDER — DIAZEPAM 5 MG PO TABS: 5.0000 mg | ORAL_TABLET | Freq: Once | ORAL | 0 refills | Status: DC | PRN

## 2022-12-06 NOTE — Telephone Encounter (Signed)
Called pt no answer. LM for pt informing him that we will not pursue and appeal for tadalafil as it will not change the outcome. Tadalafil is an elective medication and his insurance company was made aware of his diabetic status when prior auth was submitted initially. Advise pt on message that the great majority of our patients get the medication at [facility], [facility], ect where they are able to get the medication at a very reasonable price with a coupon.

## 2022-12-12 ENCOUNTER — Other Ambulatory Visit: Payer: Self-pay | Admitting: *Deleted

## 2022-12-12 MED ORDER — DIAZEPAM 5 MG PO TABS: 5.0000 mg | ORAL_TABLET | Freq: Once | ORAL | 0 refills | Status: AC

## 2022-12-12 NOTE — Telephone Encounter (Signed)
Pt would like to switch pharmacies from [facility], rx pended.

## 2023-01-09 ENCOUNTER — Ambulatory Visit: Payer: No Typology Code available for payment source | Admitting: Urology

## 2023-01-09 ENCOUNTER — Encounter: Payer: Self-pay | Admitting: Urology

## 2023-01-09 VITALS — BP 131/83 | HR 109 | Ht 67.0 in | Wt 197.0 lb

## 2023-01-09 DIAGNOSIS — Z9852 Vasectomy status: Secondary | ICD-10-CM

## 2023-01-09 DIAGNOSIS — Z302 Encounter for sterilization: Secondary | ICD-10-CM | POA: Diagnosis not present

## 2023-01-09 MED ORDER — NAPROXEN 500 MG PO TABS: 500.0000 mg | ORAL_TABLET | Freq: Four times a day (QID) | ORAL | 0 refills | Status: DC | PRN

## 2023-01-09 NOTE — Progress Notes (Signed)
VASECTOMY PROCEDURE NOTE:  The patient was taken to the minor procedure room and placed in the supine position. His genitals were prepped and draped in the usual sterile fashion. The right vas deferens was brought up to the skin of the right upper scrotum. The skin overlying it was anesthetized with 1% lidocaine without epinephrine, anesthetic was also injected alongside the vas deferens in the direction of the inguinal canal. The no scalpel vasectomy instrument was used to make a small perforation in the scrotal skin. The vasectomy clamp was used to grasp the vas deferens. It was carefully dissected free from surrounding structures. A 1cm segment of the vas was removed, and the cut ends of the mucosa were cauterized.  No significant bleeding was noted. The vas deferens was returned to the scrotum. The skin incision was closed with a simple interrupted stitch of 4-0 chromic.  Attention was then turned to the left side. The left vasectomy was performed in the same exact fashion. Sterile dressings were placed over each incision. The patient tolerated the procedure well.  IMPRESSION/DIAGNOSIS: The patient is a 42 year old gentleman who underwent a vasectomy today. Post-procedure instructions were reviewed. I stressed the importance of continuing to use birth control until he provides a semen specimen more than 2 months from now that demonstrates azoospermia.  We discussed return precautions including fever over 101, significant bleeding or hematoma, or uncontrolled pain. I also stressed the importance of avoiding strenuous activity for one week, no sexual activity or ejaculations for 5 days, intermittent icing over the next 48 hours, and scrotal support.   PLAN: The patient will be advised of his semen analysis results when available.

## 2023-01-09 NOTE — Patient Instructions (Signed)

## 2023-03-15 ENCOUNTER — Telehealth: Payer: Self-pay

## 2023-03-15 NOTE — Telephone Encounter (Signed)
Recommend seeing PCP unless he develops urologic symptoms, e.g. dysuria, flank pain, hematuria, urgency, frequency.

## 2023-03-15 NOTE — Telephone Encounter (Signed)
Patient called complaining of vomiting x 4 days.  History:  Recent Urologic Surgeries or Procedures: No, litho 11/2022 Kidney Stones: hx of LT sided, no evidence after litho  Pain:  Severity: denies pain Description:  N/A  Associated Signs/Symptoms:  Vomiting only  Please advise if pt should be added on tomorrow with KUB or seek care with PCP to rule out other etiologies as vomiting is the only sx currently.

## 2023-03-16 NOTE — Telephone Encounter (Signed)
Called pt's wife per DPR, no answer. Left detailed message informing her of the information below per Sam. Advised to call back for questions or concerns.

## 2023-04-10 ENCOUNTER — Other Ambulatory Visit: Payer: No Typology Code available for payment source

## 2023-04-10 DIAGNOSIS — Z9852 Vasectomy status: Secondary | ICD-10-CM

## 2023-04-12 LAB — POST-VAS SPERM EVALUATION,QUAL: Volume: 1.5 mL

## 2023-11-15 ENCOUNTER — Encounter: Payer: Self-pay | Admitting: Emergency Medicine

## 2023-11-15 ENCOUNTER — Ambulatory Visit
Admission: EM | Admit: 2023-11-15 | Discharge: 2023-11-15 | Disposition: A | Discharge status: Home / Self Care | Payer: No Typology Code available for payment source | Attending: Family Medicine | Admitting: Family Medicine

## 2023-11-15 DIAGNOSIS — S29019A Strain of muscle and tendon of unspecified wall of thorax, initial encounter: Secondary | ICD-10-CM

## 2023-11-15 DIAGNOSIS — S161XXA Strain of muscle, fascia and tendon at neck level, initial encounter: Secondary | ICD-10-CM

## 2023-11-15 MED ORDER — NAPROXEN 500 MG PO TABS: 500.0000 mg | ORAL_TABLET | Freq: Two times a day (BID) | ORAL | 0 refills | Status: AC

## 2023-11-15 MED ORDER — METHOCARBAMOL 500 MG PO TABS: 500.0000 mg | ORAL_TABLET | Freq: Two times a day (BID) | ORAL | 0 refills | Status: AC
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
## 2023-11-16 ENCOUNTER — Ambulatory Visit: Payer: No Typology Code available for payment source

## 2024-04-25 NOTE — Progress Notes (Deleted)
 Sleep Medicine   Office Visit  Patient Name: Christopher Livingston. DOB: [birth year] MRN [id number]    Chief Complaint: ***  Brief History:  Christopher Livingston presents for an initial consult for sleep evaluation and to establish care. Patient has a *** history of ***. Sleep quality is ***. This is noted *** night. The patient's bed partner reports  *** at night. The patient relates the following symptoms: *** are also present. The patient goes to sleep at *** and wakes up at ***.  Sleep quality is *** when outside home environment.  Patient has noted *** of her legs at night that would disrupt her sleep.  The patient  relates *** as unusual behavior during the night.  The patient relates  *** as a history of psychiatric problems. The Epworth Sleepiness Score is *** out of 24 .  The patient relates  Cardiovascular risk factors include: *** The patient reports ***   ROS  General: (-) fever, (-) chills, (-) night sweat Nose and Sinuses: (-) nasal stuffiness or itchiness, (-) postnasal drip, (-) nosebleeds, (-) sinus trouble. Mouth and Throat: (-) sore throat, (-) hoarseness. Neck: (-) swollen glands, (-) enlarged thyroid, (-) neck pain. Respiratory: *** cough, *** shortness of breath, *** wheezing. Neurologic: *** numbness, *** tingling. Psychiatric: *** anxiety, *** depression Sleep behavior: ***sleep paralysis ***hypnogogic hallucinations ***dream enactment      ***vivid dreams ***cataplexy ***night terrors ***sleep walking   Current Medication: Outpatient Encounter Medications as of 04/28/2024  Medication Sig   ammonium lactate (AMLACTIN) 12 % cream Apply topically as needed for dry skin.   augmented betamethasone dipropionate (DIPROLENE-AF) 0.05 % ointment Apply topically as needed.   chlorpheniramine-HYDROcodone (TUSSIONEX) 10-8 MG/5ML SUER Take by mouth as needed.   Continuous Blood Gluc Receiver (DEXCOM G7 RECEIVER) DEVI USE 1 DEVICE CONTINUOUSLY   Continuous Blood Gluc Sensor (DEXCOM G7  SENSOR) MISC Use 1 Device continuously   Cysteamine Bitartrate (PROCYSBI) 300 MG PACK as needed.   diclofenac Sodium (VOLTAREN) 1 % GEL Apply 2 g topically as needed.   escitalopram (LEXAPRO) 20 MG tablet Take 1 tablet by mouth daily.   FIASP  FLEXTOUCH 100 UNIT/ML FlexTouch Pen Inject into the skin.   fluticasone (FLONASE) 50 MCG/ACT nasal spray as needed.   Insulin  Disposable Pump (V-GO 20) 20 UNIT/24HR KIT USE 1 DEVICE DAILY   Insulin  Glargine w/ Trans Port (BASAGLAR TEMPO PEN) 100 UNIT/ML SOPN Inject into the skin.   methocarbamol  (ROBAXIN ) 500 MG tablet Take 1 tablet (500 mg total) by mouth 2 (two) times daily.   Multiple Vitamin (MULTIVITAMIN ADULT PO) Take 1 tablet by mouth 2 (two) times daily.   naproxen  (NAPROSYN ) 500 MG tablet Take 1 tablet (500 mg total) by mouth 2 (two) times daily with a meal.   NOVOLOG  100 UNIT/ML injection Inject into the skin.   ONETOUCH VERIO test strip CHECK BLOOD GLUCOSE UP TO 5 TIMES DAILY AS DIRECTED   tadalafil  (CIALIS ) 10 MG tablet Take 1-2 tablets (10-20 mg total) by mouth daily as needed for erectile dysfunction.   [DISCONTINUED] promethazine  (PHENERGAN ) 25 MG tablet Take 1 tablet (25 mg total) by mouth every 6 (six) hours as needed for nausea or vomiting.   No facility-administered encounter medications on file as of 04/28/2024.    Surgical History: Past Surgical History:  Procedure Laterality Date   EXTRACORPOREAL SHOCK WAVE LITHOTRIPSY Left 11/02/2022   Procedure: EXTRACORPOREAL SHOCK WAVE LITHOTRIPSY (ESWL);  Surgeon: Penne Knee, MD;  Location: [facility] OR[facility];  Service: Urology;  Laterality: Left;  FRACTURE SURGERY      Medical History: Past Medical History:  Diagnosis Date   Diabetes mellitus without complication (HCC)    Hypertension     Family History: Non contributory to the present illness  Social History: Social History   Socioeconomic History   Marital status: Married    Spouse name: Not on file   Number of children: Not on  file   Years of education: Not on file   Highest education level: Not on file  Occupational History   Not on file  Tobacco Use   Smoking status: Never    Passive exposure: Never   Smokeless tobacco: Never  Vaping Use   Vaping status: Never Used  Substance and Sexual Activity   Alcohol use: Not Currently   Drug use: Not Currently   Sexual activity: Yes    Birth control/protection: None  Other Topics Concern   Not on file  Social History Narrative   Not on file   Social Drivers of Health   Financial Resource Strain: High Risk (03/05/2024)   Received from [facility]   Overall Financial Resource Strain (CARDIA)    Difficulty of Paying Living Expenses: Very hard  Food Insecurity: Food Insecurity Present (03/05/2024)   Received from [facility]   Hunger Vital Sign    Within the past 12 months, you worried that your food would run out before you got the money to buy more.: Sometimes true    Within the past 12 months, the food you bought just didn't last and you didn't have money to get more.: Sometimes true  Transportation Needs: Unmet Transportation Needs (03/05/2024)   Received from [facility] - Transportation    In the past 12 months, has lack of transportation kept you from medical appointments or from getting medications?: Yes    Lack of Transportation (Non-Medical): Yes  Physical Activity: Sufficiently Active (03/05/2024)   Received from [facility]   Exercise Vital Sign    On average, how many days per week do you engage in moderate to strenuous exercise (like a brisk walk)?: 7 days    On average, how many minutes do you engage in exercise at this level?: 30 min  Stress: Stress Concern Present (03/05/2024)   Received from [facility] - Occupational Stress Questionnaire    Feeling of Stress : Very much  Social Connections: Moderately  Isolated (03/05/2024)   Received from [facility]   Social Connection and Isolation Panel    In a typical week, how many times do you talk on the phone with family, friends, or neighbors?: Once a week    How often do you get together with friends or relatives?: Never    How often do you attend church or religious services?: More than 4 times per year    Do you belong to any clubs or organizations such as church groups, unions, fraternal or athletic groups, or school groups?: No    How often do you attend meetings of the clubs or organizations you belong to?: Never    Are you married, widowed, divorced, separated, never married, or living with a partner?: Married  Intimate Partner Violence: Not on file    Vital Signs: There were no vitals taken for this visit. There is no height or weight on file to calculate BMI.   Examination: General Appearance: The patient is well-developed, well-nourished,  and in no distress. Neck Circumference: *** Skin: Gross inspection of skin unremarkable. Head: normocephalic, no gross deformities. Eyes: no gross deformities noted. ENT: ears appear grossly normal Neurologic: Alert and oriented. No involuntary movements.    STOP BANG RISK ASSESSMENT S (snore) Have you been told that you snore?     YES/N   T (tired) Are you often tired, fatigued, or sleepy during the day?   YES/NO  O (obstruction) Do you stop breathing, choke, or gasp during sleep? YES/NO   P (pressure) Do you have or are you being treated for high blood pressure? YES/NO   B (BMI) Is your body index greater than 35 kg/m? YES/NO   A (age) Are you 6 years old or older? YES/NO   N (neck) Do you have a neck circumference greater than 16 inches?   YES/NO   G (gender) Are you a male? YES/NO   TOTAL STOP/BANG "YES" ANSWERS                                                                A STOP-Bang score of 2 or less is considered low risk, and a score of 5 or more is  high risk for having either moderate or severe OSA. For people who score 3 or 4, doctors may need to perform further assessment to determine how likely they are to have OSA.         EPWORTH SLEEPINESS SCALE:  Scale:  (0)= no chance of dozing; (1)= slight chance of dozing; (2)= moderate chance of dozing; (3)= high chance of dozing  Chance  Situtation    Sitting and reading: ***    Watching TV: ***    Sitting Inactive in public: ***    As a passenger in car: ***      Lying down to rest: ***    Sitting and talking: ***    Sitting quielty after lunch: ***    In a car, stopped in traffic: ***   TOTAL SCORE:   *** out of 24    SLEEP STUDIES:  ***   LABS: No results found for this or any previous visit (from the past 2160 hours).  Radiology: No results found.  No results found.  No results found.    Assessment and Plan: Patient Active Problem List   Diagnosis Date Noted   Mixed diabetic hyperlipidemia associated with type 2 diabetes mellitus (HCC) 03/25/2021   Type 2 diabetes mellitus (HCC) 03/25/2021   Chronic pain of left ankle 03/17/2021   Erectile dysfunction 03/17/2021   Psoriasis 03/17/2021     PLAN OSA:   Patient evaluation suggests high risk of sleep disordered breathing due to *** Patient has comorbid cardiovascular risk factors including: *** which could be exacerbated by pathologic sleep-disordered breathing.  Suggest: *** to assess/treat the patient's sleep disordered breathing. The patient was also counselled on *** to optimize sleep health.  PLAN hypersomnia:  Patient evaluation suggests significant daytime hypersomnia.  The Epworth Sleepiness Score is elevated at *** out of 24. Patient *** drowsy driving. The patient *** MVA due to sleepiness.  The patient *** restless leg symptoms which exacerbate *** for *** nights per week. The patient *** periodic limb movements which exacerbate ***  for *** nights per week. Suggest: ***  Also suggest  ***  PLAN insomnia:  Patient evaluation suggests *** insomnia. This is a chronic disorder. This has been a concern for *** and causes impaired daytime functioning. The patient exhibits comorbid ***  The history *** suggest the insomnia predates the use of hypnotic medications. The symptoms *** with the discontinuation of these medications. There is no obvious medical, psychiatric or pharmacologic abuse issues ot account for the insomnia.  Treatment recommendations include: *** The patient should maintain a sleep log and calculate total sleep time for 1-2 weeks. Set bed and wake times for achieve 85% sleep efficiency for one week. Once this is achieved  time in bed can be gradually increased. A pharmacologic treatment approach would include a trial of *** for the next ***  months. During this time the patient is to maintain a sleep diary to track progress.    ***  General Counseling: I have discussed the findings of the evaluation and examination with Christopher Livingston.  I have also discussed any further diagnostic evaluation thatmay be needed or ordered today. Christopher Livingston verbalizes understanding of the findings of todays visit. We also reviewed his medications today and discussed drug interactions and side effects including but not limited excessive drowsiness and altered mental states. We also discussed that there is always a risk not just to him but also people around him. he has been encouraged to call the office with any questions or concerns that should arise related to todays visit.  No orders of the defined types were placed in this encounter.       I have personally obtained a history, evaluated the patient, evaluated pertinent data, formulated the assessment and plan and placed orders.    Elfreda DELENA Bathe, MD [facility] Diplomate ABMS Pulmonary and Critical Care Medicine Sleep medicine

## 2024-04-28 ENCOUNTER — Ambulatory Visit: Payer: Self-pay
